# Patient Record
Sex: Male | Born: 1950 | Race: White | Hispanic: No | Smoking: Never smoker
Health system: Southern US, Community
[De-identification: ages and names within clinical notes are randomized; demographics above are authoritative.]

## PROBLEM LIST (undated history)

## (undated) DIAGNOSIS — S83206A Unspecified tear of unspecified meniscus, current injury, right knee, initial encounter: Secondary | ICD-10-CM

## (undated) DIAGNOSIS — E785 Hyperlipidemia, unspecified: Secondary | ICD-10-CM

## (undated) DIAGNOSIS — M1990 Unspecified osteoarthritis, unspecified site: Secondary | ICD-10-CM

## (undated) DIAGNOSIS — K635 Polyp of colon: Secondary | ICD-10-CM

## (undated) DIAGNOSIS — R0602 Shortness of breath: Secondary | ICD-10-CM

## (undated) HISTORY — DX: Hyperlipidemia, unspecified: E78.5

## (undated) HISTORY — PX: CARDIAC CATHETERIZATION: SHX172

## (undated) HISTORY — PX: CATARACT EXTRACTION W/ INTRAOCULAR LENS  IMPLANT, BILATERAL: SHX1307

## (undated) HISTORY — DX: Polyp of colon: K63.5

---

## 1979-05-08 HISTORY — PX: LUMBAR FUSION: SHX111

## 1999-12-16 ENCOUNTER — Inpatient Hospital Stay (HOSPITAL_COMMUNITY): Admission: EM | Admit: 1999-12-16 | Discharge: 1999-12-18 | Payer: Self-pay | Admitting: Emergency Medicine

## 1999-12-16 ENCOUNTER — Encounter: Payer: Self-pay | Admitting: Emergency Medicine

## 1999-12-17 ENCOUNTER — Encounter: Payer: Self-pay | Admitting: Cardiovascular Disease

## 2006-06-02 ENCOUNTER — Ambulatory Visit: Payer: Self-pay | Admitting: Family Medicine

## 2006-06-03 ENCOUNTER — Encounter (INDEPENDENT_AMBULATORY_CARE_PROVIDER_SITE_OTHER): Payer: Self-pay | Admitting: Internal Medicine

## 2006-06-03 LAB — CONVERTED CEMR LAB: PSA: 2.93 ng/mL

## 2006-06-09 ENCOUNTER — Ambulatory Visit: Payer: Self-pay | Admitting: Cardiology

## 2006-06-15 ENCOUNTER — Ambulatory Visit: Payer: Self-pay | Admitting: Cardiology

## 2006-06-15 ENCOUNTER — Ambulatory Visit (HOSPITAL_COMMUNITY): Admission: RE | Admit: 2006-06-15 | Discharge: 2006-06-15 | Payer: Self-pay | Admitting: Cardiology

## 2006-06-17 ENCOUNTER — Inpatient Hospital Stay (HOSPITAL_BASED_OUTPATIENT_CLINIC_OR_DEPARTMENT_OTHER): Admission: RE | Admit: 2006-06-17 | Discharge: 2006-06-17 | Payer: Self-pay | Admitting: Cardiovascular Disease

## 2006-06-17 ENCOUNTER — Ambulatory Visit: Payer: Self-pay | Admitting: Cardiovascular Disease

## 2006-07-01 ENCOUNTER — Ambulatory Visit: Payer: Self-pay | Admitting: Cardiology

## 2007-04-18 ENCOUNTER — Ambulatory Visit: Payer: Self-pay | Admitting: Internal Medicine

## 2007-04-18 DIAGNOSIS — K4090 Unilateral inguinal hernia, without obstruction or gangrene, not specified as recurrent: Secondary | ICD-10-CM | POA: Insufficient documentation

## 2007-04-19 ENCOUNTER — Encounter (INDEPENDENT_AMBULATORY_CARE_PROVIDER_SITE_OTHER): Payer: Self-pay | Admitting: Internal Medicine

## 2007-05-24 ENCOUNTER — Ambulatory Visit (HOSPITAL_COMMUNITY): Admission: RE | Admit: 2007-05-24 | Discharge: 2007-05-24 | Payer: Self-pay | Admitting: *Deleted

## 2007-05-24 HISTORY — PX: INGUINAL HERNIA REPAIR: SUR1180

## 2007-10-15 ENCOUNTER — Emergency Department (HOSPITAL_COMMUNITY): Admission: EM | Admit: 2007-10-15 | Discharge: 2007-10-15 | Payer: Self-pay | Admitting: Family Medicine

## 2007-12-19 ENCOUNTER — Ambulatory Visit: Payer: Self-pay | Admitting: Family Medicine

## 2007-12-19 DIAGNOSIS — L989 Disorder of the skin and subcutaneous tissue, unspecified: Secondary | ICD-10-CM | POA: Insufficient documentation

## 2007-12-21 ENCOUNTER — Encounter (INDEPENDENT_AMBULATORY_CARE_PROVIDER_SITE_OTHER): Payer: Self-pay | Admitting: Internal Medicine

## 2007-12-21 DIAGNOSIS — E785 Hyperlipidemia, unspecified: Secondary | ICD-10-CM

## 2007-12-21 DIAGNOSIS — F528 Other sexual dysfunction not due to a substance or known physiological condition: Secondary | ICD-10-CM

## 2007-12-21 DIAGNOSIS — H269 Unspecified cataract: Secondary | ICD-10-CM

## 2007-12-21 LAB — CONVERTED CEMR LAB
Albumin: 4.1 g/dL (ref 3.5–5.2)
Alkaline Phosphatase: 71 units/L (ref 39–117)
BUN: 14 mg/dL (ref 6–23)
Bilirubin, Direct: 0.1 mg/dL (ref 0.0–0.3)
Calcium: 9.7 mg/dL (ref 8.4–10.5)
Cholesterol: 209 mg/dL (ref 0–200)
Eosinophils Absolute: 0.1 10*3/uL (ref 0.0–0.7)
GFR calc Af Amer: 112 mL/min
GFR calc non Af Amer: 92 mL/min
Glucose, Bld: 90 mg/dL (ref 70–99)
HCT: 46.7 % (ref 39.0–52.0)
Hemoglobin: 16.1 g/dL (ref 13.0–17.0)
MCV: 95.8 fL (ref 78.0–100.0)
Monocytes Absolute: 0.2 10*3/uL (ref 0.1–1.0)
Neutro Abs: 3.7 10*3/uL (ref 1.4–7.7)
PSA: 2.04 ng/mL (ref 0.10–4.00)
Platelets: 239 10*3/uL (ref 150–400)
Potassium: 4.2 meq/L (ref 3.5–5.1)
RDW: 12.3 % (ref 11.5–14.6)
Sodium: 142 meq/L (ref 135–145)
TSH: 0.78 microintl units/mL (ref 0.35–5.50)
Total Protein: 7 g/dL (ref 6.0–8.3)
Triglycerides: 88 mg/dL (ref 0–149)

## 2007-12-29 ENCOUNTER — Encounter (INDEPENDENT_AMBULATORY_CARE_PROVIDER_SITE_OTHER): Payer: Self-pay | Admitting: Internal Medicine

## 2008-04-06 ENCOUNTER — Ambulatory Visit: Payer: Self-pay | Admitting: Family Medicine

## 2008-04-06 ENCOUNTER — Observation Stay (HOSPITAL_COMMUNITY): Admission: EM | Admit: 2008-04-06 | Discharge: 2008-04-06 | Payer: Self-pay | Admitting: Emergency Medicine

## 2009-01-22 ENCOUNTER — Ambulatory Visit: Payer: Self-pay | Admitting: Family Medicine

## 2009-01-22 DIAGNOSIS — M79609 Pain in unspecified limb: Secondary | ICD-10-CM

## 2009-01-24 LAB — CONVERTED CEMR LAB
ALT: 30 units/L (ref 0–53)
AST: 26 units/L (ref 0–37)
BUN: 16 mg/dL (ref 6–23)
CO2: 29 meq/L (ref 19–32)
Calcium: 9 mg/dL (ref 8.4–10.5)
Chloride: 111 meq/L (ref 96–112)
Cholesterol: 215 mg/dL — ABNORMAL HIGH (ref 0–200)
Creatinine, Ser: 0.9 mg/dL (ref 0.4–1.5)
Direct LDL: 153.7 mg/dL
GFR calc non Af Amer: 92.04 mL/min (ref >60)
Glucose, Bld: 88 mg/dL (ref 70–99)
HDL: 50.3 mg/dL (ref >39.00)
PSA: 1.73 ng/mL (ref 0.10–4.00)
Potassium: 3.7 meq/L (ref 3.5–5.1)
Sodium: 142 meq/L (ref 135–145)
Total CHOL/HDL Ratio: 4
Triglycerides: 56 mg/dL (ref 0.0–149.0)
VLDL: 11.2 mg/dL (ref 0.0–40.0)

## 2009-04-22 ENCOUNTER — Ambulatory Visit: Payer: Self-pay | Admitting: Family Medicine

## 2009-04-24 LAB — CONVERTED CEMR LAB
Cholesterol: 199 mg/dL (ref 0–200)
HDL: 50.7 mg/dL (ref >39.00)
Triglycerides: 66 mg/dL (ref 0.0–149.0)
VLDL: 13.2 mg/dL (ref 0.0–40.0)

## 2009-10-28 ENCOUNTER — Encounter (INDEPENDENT_AMBULATORY_CARE_PROVIDER_SITE_OTHER): Payer: Self-pay | Admitting: *Deleted

## 2009-11-17 ENCOUNTER — Encounter: Payer: Self-pay | Admitting: Family Medicine

## 2009-11-17 ENCOUNTER — Ambulatory Visit: Payer: Self-pay | Admitting: Family Medicine

## 2009-11-18 LAB — CONVERTED CEMR LAB
LDL Cholesterol: 122 mg/dL — ABNORMAL HIGH (ref 0–99)
Total CHOL/HDL Ratio: 3.7
VLDL: 28 mg/dL (ref 0–40)

## 2010-01-23 ENCOUNTER — Ambulatory Visit: Payer: Self-pay | Admitting: Family Medicine

## 2010-01-26 LAB — CONVERTED CEMR LAB
Albumin: 4.2 g/dL (ref 3.5–5.2)
Alkaline Phosphatase: 79 units/L (ref 39–117)
BUN: 19 mg/dL (ref 6–23)
CO2: 32 meq/L (ref 19–32)
Calcium: 9.3 mg/dL (ref 8.4–10.5)
Cholesterol: 209 mg/dL — ABNORMAL HIGH (ref 0–200)
GFR calc non Af Amer: 86.17 mL/min (ref >60)
Glucose, Bld: 97 mg/dL (ref 70–99)
HDL: 53.7 mg/dL (ref >39.00)
PSA: 2.91 ng/mL (ref 0.10–4.00)
Sodium: 141 meq/L (ref 135–145)
Total Protein: 7 g/dL (ref 6.0–8.3)
Triglycerides: 69 mg/dL (ref 0.0–149.0)

## 2010-07-07 ENCOUNTER — Ambulatory Visit: Payer: Self-pay | Admitting: Family Medicine

## 2010-07-08 ENCOUNTER — Encounter: Payer: Self-pay | Admitting: Family Medicine

## 2010-07-08 LAB — CONVERTED CEMR LAB
ALT: 24 units/L (ref 0–53)
Alkaline Phosphatase: 68 units/L (ref 39–117)
Bilirubin, Direct: 0.1 mg/dL (ref 0.0–0.3)
Cholesterol: 188 mg/dL (ref 0–200)
Indirect Bilirubin: 0.5 mg/dL (ref 0.0–0.9)
LDL Cholesterol: 119 mg/dL — ABNORMAL HIGH (ref 0–99)
VLDL: 15 mg/dL (ref 0–40)

## 2010-10-06 NOTE — Assessment & Plan Note (Signed)
Summary: CPX FOR ANNUAL PHYSICAL   Vital Signs:  Patient profile:   60 year old male Height:      69 inches Weight:      188.13 pounds BMI:     27.88 Temp:     98.2 degrees F oral Pulse rate:   64 / minute Pulse rhythm:   regular BP sitting:   120 / 90  (right arm) Cuff size:   regular  Vitals Entered By: Linde Gillis CMA Duncan Dull) (Jan 23, 2010 8:12 AM) CC: complete physical   History of Present Illness: 60 yo male new to me here for CPX  Doing well, working a lot.  Works 14 hour days, does not have time to do anything but work. Still has not gone for his colonoscopy.  He wants to wait until he has some vacation later this year.  UTD on tetanus.  ED-uses viagra as needed. Would like a refill.  Last lipid panel checked in 04/2009- LDL 135, HDL 50, TG 66.    Current Medications (verified): 1)  Adult Aspirin Ec Low Strength 81 Mg  Tbec (Aspirin) .... Take 1 Tablet By Mouth Once A Day 2)  Viagra 100 Mg  Tabs (Sildenafil Citrate) .Marland Kitchen.. 1 Once Daily As Directed  Allergies (verified): No Known Drug Allergies  Past History:  Family History: Last updated: Jan 08, 2008 Father: died at 58--cancer of ?stomach vs colon,  Mother: died at 86--breast ca with mets, alzheimers Siblings: 4 sisters--1 new DM,                                 3 L&W                4 br--1 --MI at 60yrs old, smoker--cant breath                         1 --MI at 60 yrs old, quad bypass, increased lipids,                          2--L&W   DM-son,  MI- 0 CVA- 0 Prostate Cancer- 0 Breast Cancer- 0 Ovarian Cancer-0 Uterine Cancer-0 Colon Cancer- 0 Drug/ ETOH Abuse-0 Depression-  0  Social History: Last updated: 01-08-08 Marital Status: Married Children: 3--all out of the house, grand children--6 Occupation: pulls parts at [employer]  Risk Factors: Alcohol Use: 0 (01/22/2009) Caffeine Use: 8 (01/22/2009) Exercise: no (01/22/2009)  Risk Factors: Smoking Status: never (01/22/2009) Passive Smoke  Exposure: no (2008-01-08)  Review of Systems      See HPI General:  Denies malaise. Eyes:  Denies blurring. ENT:  Denies difficulty swallowing. CV:  Denies difficulty breathing at night, shortness of breath with exertion, swelling of feet, and swelling of hands. Resp:  Denies shortness of breath. GI:  Denies abdominal pain, bloody stools, and change in bowel habits. GU:  Complains of erectile dysfunction; denies discharge and dysuria. MS:  Denies joint pain, joint redness, and joint swelling. Derm:  Denies rash. Neuro:  Denies headaches. Psych:  Denies anxiety and depression. Endo:  Denies cold intolerance and heat intolerance. Heme:  Denies abnormal bruising and bleeding. Allergy:  Denies seasonal allergies.  Physical Exam  General:  alert, well-developed, well-nourished, and well-hydrated.   Head:  normocephalic.   Eyes:  pupils equal, pupils round, and no injection.   Ears:  R ear normal and L ear normal.  Nose:  no nasal discharge, no mucosal edema, and no airflow obstruction.   Mouth:  pharynx pink and moist and poor dentition--several cavities and 1 broken tooth on L lower .   Neck:  no masses, no thyromegaly, no JVD, and no carotid bruits.   Lungs:  normal respiratory effort, no intercostal retractions, no accessory muscle use, normal breath sounds, no crackles, and no wheezes.   Heart:  normal rate, regular rhythm, and no murmur.   Abdomen:  soft, normal bowel sounds, no distention, no masses, no guarding, no abdominal hernia, no inguinal hernia, no hepatomegaly, and no splenomegaly.    Msk:  No deformity or scoliosis noted of thoracic or lumbar spine.   Extremities:  no edema either lower leg Neurologic:  alert & oriented X3, strength normal in all extremities, sensation intact to light touch, and gait normal.   Skin:  turgor normal and color normal.   Psych:  normally interactive and good eye contact.     Impression & Recommendations:  Problem # 1:  WELL ADULT EXAM  (ICD-V70.0) Reviewed preventive care protocols, scheduled due services, and updated immunizations Discussed nutrition, exercise, diet, and healthy lifestyle.  BMET, FLP, liver function, PSA today. Orders: TLB-BMP (Basic Metabolic Panel-BMET) (80048-METABOL)  Complete Medication List: 1)  Adult Aspirin Ec Low Strength 81 Mg Tbec (Aspirin) .... Take 1 tablet by mouth once a day 2)  Viagra 100 Mg Tabs (Sildenafil citrate) .Marland Kitchen.. 1 once daily as directed  Other Orders: Venipuncture (24401) TLB-Lipid Panel (80061-LIPID) TLB-Hepatic/Liver Function Pnl (80076-HEPATIC) TLB-PSA (Prostate Specific Antigen) (84153-PSA) Prescriptions: VIAGRA 100 MG  TABS (SILDENAFIL CITRATE) 1 once daily as directed  #12 x 12   Entered and Authorized by:   Ruthe Mannan MD   Signed by:   Ruthe Mannan MD on 01/23/2010   Method used:   Electronically to        [facility] (retail)       [address]       [place], [place]  [id number]       Ph: [phone]       Fax: [phone]   RxID:   [id number]   Current Allergies (reviewed today): No known allergies

## 2010-10-06 NOTE — Consult Note (Signed)
Summary: Consultation Report  Consultation Report   Imported By: Beau Fanny 05/02/2007 11:56:45  _____________________________________________________________________  External Attachment:    Type:   Image     Comment:   External Document

## 2010-10-06 NOTE — Letter (Signed)
Summary: [place] No Show Letter  [place] at [facility]  [address] [place], [place] [address]   Phone: [phone]  Fax: [phone]    10/28/2009 MRN: [id number]  NICHOLLAS PERUSSE [address] [place], [place]  [address]   Dear Mr. MOIST,   Our records indicate that you missed your scheduled appointment with ___lab__________________ on ___2.22.11_________.  Please contact this office to reschedule your appointment as soon as possible.  It is important that you keep your scheduled appointments with your physician, so we can provide you the best care possible.  Please be advised that there may be a charge for "no show" appointments.    Sincerely,    at [facility]

## 2010-10-06 NOTE — Letter (Signed)
Summary: Generic Letter  [place] at [facility]  [address] [place], [place] [address]   Phone: [phone]  Fax: [phone]    07/08/2010  OSWIN GRIFFITH [address] [place], [place]  [address]  Dear Mr. JUNIOUS,     We have received your lab results and Dr. Dayton Martes says that your cholesterol has improved since May.  Keep up the good work!  You have normal liver function.      Sincerely,       Linde Gillis CMA (AAMA)for Dr. Ruthe Mannan

## 2011-01-19 NOTE — H&P (Signed)
NAMEKENTARO, ALEWINE                 ACCOUNT NO.:  [id number]   MEDICAL RECORD NO.:  [id number]          PATIENT TYPE:  OBV   LOCATION:  6715                         FACILITY:  [facility]   PHYSICIAN:  Pearlean Brownie, M.D.DATE OF BIRTH:  1951-06-09   DATE OF ADMISSION:  04/05/2008  DATE OF DISCHARGE:                              HISTORY & PHYSICAL   PRIMARY CARE PHYSICIAN:  Unassigned.   CHIEF COMPLAINT:  Carbon monoxide poisoning.   HISTORY OF PRESENT ILLNESS:  This is a 60 year old male with no  significant past medical history who is presenting with a loss of  consciousness and headache after using a propane floor buffer inside.  In the emergency department, he was found to have a 29%  carboxyhemoglobin level and was put on 100% oxygen nonrebreather.  He  complains of chest tightness when he awoke, but this resolved  spontaneously and he currently has none.  He currently has a headache  and nausea without vomiting.  He denies shortness of breath, fatigue,  focal weakness, or paresthesia.  He has fell onto the vinyl interior  floors.  He was puffing but did not strike his head.  In the emergency  department, two sets of point of care cardiac enzymes are negative, and  his EKG shows no ischemic patterns.  The patient's urine drug screen is  negative, and his alcohol screen is consistent with his stated  consumption of two beers.  He was placed on 100% nonrebreather oxygen in  the emergency department.   PAST MEDICAL HISTORY:  1. Inguinal hernia surgery in September 2008.  2. Typical angina with normal cardiac catheterization in October 2007.   FAMILY HISTORY:  Negative for early coronary artery disease.   SOCIAL HISTORY:  He lives in [place].  He is married but is in the  process of separating from his wife.  He has adult children.  He does  not smoke or use illicit drugs, and he does drink two beers nightly.  He  is currently working at two separate jobs.  He works in a  salvage ER  during the day, and he does cleaning at night.   ALLERGIES:  No known drug allergies.   MEDICATIONS:  No chronic medications.   REVIEW OF SYSTEMS:  Per HPI.   PHYSICAL EXAM:  VITAL SIGNS:  Heart rate 57-95, respirations 20-24,  blood pressure 125-157 over 75-86, saturations 97-100% on nonrebreather.  GENERAL:  He is alert and oriented.  No acute distress, talkative,  cooperative, and pleasant.  HEENT: Normocephalic and atraumatic.  Pupils are equally round and  reactive to light.  Extraocular muscles are intact.  CARDIO:  Regular rate and rhythm with no murmurs, rubs, or gallops.  2+  radial pulses bilateral.  PULMONARY:  Clear to auscultation bilateral with normal work of  breathing.  EXTREMITIES:  Nontender without edema.   LABS AND RADIOLOGY:  His carboxyhemoglobin is 29%, venous blood gas has  a pH of 7.417, pCO2 of 38, pO2 of 56, and bicarb of 24.6.  Urine drug  screen negative.  Alcohol level is 66.  Basic metabolic  panel, sodium  141, potassium 3.4, chloride 107, bicarb 24, BUN 21, creatinine 1.2,  glucose 106.  Complete blood count, white blood cell 5.9, hemoglobin  15.7, hematocrit 44.9, platelets 215.   ASSESSMENT AND PLAN:  This is a 60 year old male with no significant  past medical history presenting with carbon monoxide poisoning.  1. Carbon monoxide poisoning.  His history symptoms and labs are      consistent with an accidental carbonmonoxide poisoning incident.      Treatment for carboxyhemoglobin less than 40% is oxygen at 100% via      nonrebreather until carboxyhemoglobin is less than 10% and there      are no focal neurologic signs or coronary ischemia.  Since the half-      life of carboxyhemoglobin is approximately 90 units on      nonrebreather, the patient should be cleared of carbon monoxide by      early morning.  We will continue with nonrebreather and recheck      carboxyhemoglobin at 7:00 a.m..  We will also check one more set of       cardiac enzymes to completely rule out cardiac ischemia, which      would be secondary to the carbon monoxide poisoning.  The patient      is in no distress and without signs of end-organ damage.  2. Fluids, electrolytes, nutrition/gastrointestinal.  Regular adult      diet.  Hep-Lock IVF.  3. Prophylaxis.  Early ambulation.  4. Disposition.  A 23-hour observation likely home in the morning.      Discharge criteria are less than 10% carboxyhemoglobin, no focal      neurologic signs, no coronary ischemia, and no signs of end-organ      damage.      Romero Belling, MD  Electronically Signed      Pearlean Brownie, M.D.  Electronically Signed    MO/MEDQ  D:  04/06/2008  T:  04/06/2008  Job:  [id number]

## 2011-01-19 NOTE — Discharge Summary (Signed)
[name], [name] [name]                 ACCOUNT NO.:  [id number]   MEDICAL RECORD NO.:  [id number]          PATIENT TYPE:  OBV   LOCATION:  6715                         FACILITY:  [facility]   PHYSICIAN:  Pearlean Brownie, M.D.DATE OF BIRTH:  [birth year]   DATE OF ADMISSION:  04/05/2008  DATE OF DISCHARGE:  04/06/2008                               DISCHARGE SUMMARY   REASON FOR ADMISSION:  Carbon monoxide poisoning.   SIGNIFICANT FINDINGS:  The patient was a 60 year old male with no past  medical history who presented with loss of consciousness and headache  after using a propane for buffer inside a closed space.  The patient was  found in the ED to have a 29% carboxyhemoglobin level and an alcohol  level of 66 mg/dL.  Cardiac enzymes were negative x3.  The patient was  put on 100% non-rebreather mask and symptoms of headache and chest  tightness slowly resolved.  On discharge, the patient is asymptomatic  with only a mild headache carboxyhemoglobin level is normal at 0.5% the  patient metabolic panel was within normal limits.  CBC was within normal  limits.  Urine drug screen was negative.  The patient will be discharged  today with instructions to follow up with the [facility] and  to avoid using internal combustion engines in closed spaces.   Follow up will be with [facility].  The patient  was instructed to call on Monday to make an appointment to see me in one  week and is instructed to go to the ED if he develops shortness of  breath, chest pain, or dizziness.   CONDITION AT DISCHARGE:  Stable and improved.  There will be no  medications given to the patient on discharge.  He is instructed that he  can use over-the-counter Tylenol if desired for headache.      Rodney Langton, MD  Electronically Signed      Pearlean Brownie, M.D.  Electronically Signed    TT/MEDQ  D:  04/06/2008  T:  04/07/2008  Job:  [id number]

## 2011-01-19 NOTE — Op Note (Signed)
[name] [name], [name] [name]                 ACCOUNT NO.:  [id number]   MEDICAL RECORD NO.:  [id number]          PATIENT TYPE:  AMB   LOCATION:  DAY                          FACILITY:  [facility]   PHYSICIAN:  Alfonse Ras, MD   DATE OF BIRTH:  1951-04-18   DATE OF PROCEDURE:  05/24/2007  DATE OF DISCHARGE:                               OPERATIVE REPORT   PREOPERATIVE DIAGNOSIS:  Right inguinal hernia.   POSTOPERATIVE DIAGNOSIS:  Right inguinal hernia.   PROCEDURE:  Right inguinal hernia repair with mesh.   SURGEON:  Alfonse Ras, M.D.   ANESTHESIA:  General laryngeal mask.   SPECIMENS:  None.   ESTIMATED BLOOD LOSS:  Minimal.   DESCRIPTION OF PROCEDURE:  The patient was taken to the operating room  and placed in the supine position.  After adequate general anesthesia  was induced using the laryngeal mask, the right groin was prepped and  draped in normal sterile fashion.  Using an oblique incision over the  inguinal canal, I dissected down using Bovie electrocautery to the  external oblique fascia.  This was opened along its fibers down to the  external ring.  The ilioinguinal nerve was identified and preserved and  retracted cephalad.  The spermatic cord was surrounded with a Penrose  drain right at the pubic tubercle.  Direct hernia defect was identified  and was closed primarily in a tension-free fashion by approximating the  inguinal ligament to the transversalis fascia with interrupted 0  Surgilon sutures.  This was taken up to the internal ring.  No indirect  hernia sac was identified.  A piece of onlay 3 x 6 atrium mesh was then  placed over the repair and was cut to fashion.  It was split and brought  out lateral to the internal ring.  It was fixed in place starting at the  pubic tubercle with a running 2-0 Prolene suture to the transversalis  fascia, split and brought out lateral to the internal ring.  It was also  fixed to the inguinal ligament.  This was fixed out to the  transversalis  fascia laterally.  The defect in the mesh lateral to the internal ring  was closed along with a 2-0 Prolene suture to allow only a Kelly clamp  and the tip of the Kelly clamp to be allowed next to the spermatic cord  at the internal ring.  All tissues were injected using Marcaine.  The  hernia repair was completely intact and the external oblique fascia was  closed with a running 3-0 Vicryl suture.  Skin incision was closed with  staples.  A sterile dressing was applied.  The patient tolerated the  procedure well and went to PACU in good condition.      Alfonse Ras, MD  Electronically Signed     KRE/MEDQ  D:  05/24/2007  T:  05/24/2007  Job:  [id number]

## 2011-01-22 ENCOUNTER — Encounter: Payer: Self-pay | Admitting: Family Medicine

## 2011-01-22 NOTE — Discharge Summary (Signed)
[place]  Patient:    [name] [name], [name] [name]                     MRN: [id number] Adm. Date:  [id number] Disc. Date: 12/18/99 Attending:  Colon Branch Dictator:   Joellyn Rued, P.A.C. CC:         Olena [facility]                  Referring Physician Discharge [place]  DATE OF BIRTH:  1951-03-14  SUMMARY OF HISTORY:  [name] is a 60 year old white male without prior cardiac  history.  He presented with sudden sharp left-sided chest discomfort.  He denies prior history of chest discomfort at rest or with exertion.  His risk factors include hyperlipidemia.  Around 4 p.m. 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  He was seen by the Urgent Care staff on [address] and prescribed two aspirin and sublingual nitroglycerin with some relief, but the discomfort continued to wax and wane.  Currently it is a 5/10.  His admission EKG showed sinus bradycardia, a left axis deviation, diffuse ST segment elevations,  nonspecific ST-T wave changes.  The discomfort was pleuritic in nature.  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  LABORATORY DATA:  H&H of 15.2 and 41.0, MCHC was slightly elevated at 37.1, platelets 253, wbcs 8.2.  Sodium 139, potassium 3.7, BUN 20, creatinine 0.9, glucose 116.  CKs and troponins were negative x 3.  Fasting lipids showed a total cholesterol of 186, triglycerides 186, HDL 121, LDL 41, with a ratio of 4.5.  Chest x-ray is not on the chart.  EKG showed sinus bradycardia, left axis deviation, nonspecific ST-T wave changes.  HOSPITAL COURSE:  [name] was admitted to 5100.  Overnight, he did not have any further  discomfort and enzymes and EKGs were negative for myocardial infarction. An adenosine Cardiolite was performed on April 12 without difficulty.  Imaging showed an EF of 56%, no ischemia or scarring.  Dr. Eden Emms reviewed these findings and felt that he could be discharged home.  DISCHARGE DISPOSITION:  MEDICATIONS:  He is asked to continue his propranolol 40 mg t.i.d.  DIET:  Maintain low salt/fat/cholesterol diet.  ACTIVITY:  He stated that he could go back to exercising.  FOLLOW-UP:  He was asked to arrange a follow-up appointment with ______ of [facility]. DD:  12/18/99 TD:  12/18/99 Job: [id number]

## 2011-01-22 NOTE — Assessment & Plan Note (Signed)
[facility]                                   ON-CALL NOTE   NAME:[name] [name], [name] [name]                        MRN:          [id number]  DATE:06/12/2006                            DOB:          04/01/1951    ON CALL NOTE:   PRIMARY CARDIOLOGIST:  Dr. Simona Huh.   [name] [name] is a 60 year old male with cardiac risk factors but no known  disease.  He was evaluated by Dr. Diona Browner in June 09, 2006 and he was  started on Toprol XL 25 mg q. day and sublingual nitroglycerin.   I received a call from Mrs. Coye saying that she had lost the prescription  and was unable to locate it and requesting that it be called into [facility].  Initially the phone number which she gave me did not work and she stated  that she would assist me in getting the phone number for the [facility] which  she wished to use, which was not listed in any of the materials that I have  available to me.  She stated that if I would call this into [facility] she  would go and get it filled and he would begin taking it.  She stated that he  was not currently having any chest pain or shortness of breath.  He is  currently asymptomatic.  I told her that I would call in the medication as  soon as I was able to locate the correct phone number.      ______________________________  Theodore Demark, PA-C    ______________________________  Gerrit Friends. Dietrich Pates, MD, [facility]     RB/MedQ  DD:  06/12/2006  DT:  06/13/2006  Job #:  [id number]   cc:   Jonelle Sidle, MD

## 2011-01-22 NOTE — Cardiovascular Report (Signed)
NAMEHARLOW, [name] [name]                 ACCOUNT NO.:  [id number]   MEDICAL RECORD NO.:  [id number]          PATIENT TYPE:  OIB   LOCATION:  1963                         FACILITY:  [facility]   PHYSICIAN:  Veverly Fells. Excell Seltzer, MD  DATE OF BIRTH:  08-Oct-1950   DATE OF PROCEDURE:  06/17/2006  DATE OF DISCHARGE:  06/17/2006                              CARDIAC CATHETERIZATION   INDICATION:  Mr. Kapaun is a very pleasant 60 year old male who has developed  recent onset of typical angina.  He has seen Dr. Diona Browner in the outpatient  clinic, and with his suggestive history for cardiac angina, he was referred  for diagnostic catheterization.   PROCEDURE:  Left heart catheterization, selective coronary angiography, left  ventricular angiography.   ACCESS:  Right femoral artery 4-French sheath.   COMPLICATIONS:  None.   PROCEDURAL DETAILS:  The risks and indications of the procedure were  explained in detail to the patient.  Informed consent was obtained.  Using a  normal sterile technique, the right groin was prepped, draped and  anesthetized with 1% lidocaine.  Using the modified Seldinger technique, a 4-  French arterial sheath was placed in the right common femoral artery.  Multiple angiographic views of both the left and right coronary arteries  were taken.  For the left coronary artery, a preformed JL4 catheter was  used.  For the right coronary artery, a preformed JR4 catheter was used.  Following selective coronary angiography, a 4-French angled pigtail catheter  was placed in the left ventricle, and left ventricular pressures were  recorded.  A right anterior oblique left ventriculogram was performed.  Following left ventriculography, a pullback across the aortic valve was  performed.   All catheter exchanges were performed over a wire.  At the conclusion in the  case, the femoral sheath was pulled and manual pressure was used for  hemostasis.   FINDINGS:  Aortic pressure 105/67, with a  mean of 85, left ventricular  pressure 107/0 with an end diastolic pressure of 10.  There is no aortic  stenosis.   Coronary angiography at left main stem was angiographically normal.  It  trifurcates into the LAD, ramus intermedius and left circumflex.   LAD - The LAD is a medium caliber vessel.  It courses around to the left  ventricular apex.  It gives off multiple septal perforators.  There were no  diagonal branches present.  The LAD is angiographically normal.   Ramus intermedius is a medium diameter vessel.  It bifurcates at its mid  portion.  It is angiographically normal.   The left circumflex is a large diameter vessel.  It gives off a small obtuse  marginal and a medium caliber second obtuse marginal.  There is a very large  left posterolateral branch.  The entire left circumflex system is  angiographically normal.   Right coronary artery is medium caliber.  It gives off an RV marginal branch  in its mid portion.  Distally, it bifurcates into a 1 posterolateral branch  and a PDA.  The PDA is relatively small.  The entire right coronary  artery  is angiographically normal.   Left ventriculogram shows normal left ventricular function with a left  ventricular ejection fraction of 55%.   ASSESSMENT:  1. Normal coronary arteries.  2. Normal left ventricular function.   PLAN:  The patient was recently started on metoprolol for his angina.  We  will discontinue his metoprolol.  He will follow up on a p.r.n. basis.      Veverly Fells. Excell Seltzer, MD  Electronically Signed     MDC/MEDQ  D:  06/17/2006  T:  06/17/2006  Job:  [id number]   cc:   Jonelle Sidle, MD

## 2011-01-22 NOTE — Assessment & Plan Note (Signed)
[facility]                              CARDIOLOGY OFFICE NOTE   NAME:[name], [name] STAFFORD                        MRN:          [id number]  DATE:07/01/2006                            DOB:          [birth year]    PRIMARY CARE Alahni Varone:  Atha Starks. Bean, FNP.   REASON FOR VISIT:  Followup cardiac catheterization.   HISTORY OF PRESENT ILLNESS:  I saw [name] [name] back in October.  He presented  at that time with symptoms suggestive of angina.  After discussion of  diagnostic options, he ultimately agreed to a cardiac catheterization, which  was performed by Dr. Excell Seltzer on October 12.  This study revealed normal  coronary arteries with normal left ventricular systolic function of 55%.  He  was reassured by the findings and, on presentation today, states that he has  actually been feeling much better.  Clear etiology of his symptoms is not  certain, although his cardiac status looks to be quite reassuring.  I spoke  with him about basic risk factor modification and observation of any marked  symptom change.  He has had no problems with his right groin site status  post catheterization.   ALLERGIES:  No known drug allergies.   PRESENT MEDICATIONS:  None at this time.   REVIEW OF SYSTEMS:  As per in the history of present illness.   EXAMINATION:  Blood pressure is 134/87, heart rate is 65, weight is 182  pounds.  The patient is comfortable in no acute distress.  Examination of the neck reveals no elevated jugular venous pressure, without  bruits.  CARDIAC:  Regular rate and rhythm without rub, murmur, or gallop.  Examination of the right groin site reveals no hematoma or bruit with well-  healed site.  EXTREMITIES:  No pitting edema.   IMPRESSION/RECOMMENDATIONS:  1. Recently documented normal coronary arteries with normal ejection      fraction at cardiac catheterization.  I have recommended the strategy      of risk factor modification.  His blood pressure  is mildly elevated and      I have suggested that he followup with Billie Bean in this regard.  He      should also have a baseline set of lipids obtained and be observant of      any new or concerning symptoms.  He will otherwise follow up with [place] as      needed.  2. Continue regular followup with primary care Jimmylee Ratterree.     Jonelle Sidle, MD   SGM/MedQ  DD: 07/01/2006  DT: 07/03/2006  Job #: [id number]   cc:   Billie D. Bean, FNP

## 2011-01-22 NOTE — Assessment & Plan Note (Signed)
[facility]                              CARDIOLOGY OFFICE NOTE   NAME:[name] [name], [name] [name]                        MRN:          [id number]  DATE:06/09/2006                            DOB:          1951-03-31    REFERRING PHYSICIAN:  Billie D. Bean, FNP   REASON FOR CONSULTATION:  Chest pain and abnormal electrocardiogram.   HISTORY OF PRESENT ILLNESS:  [name] [name] is a pleasant 60 year old male with no  reported history of hypertension, type 2 diabetes mellitus or dyslipidemia.  He does have a history of gastroesophageal reflux disease and is status post  previous Nissen fundoplication.  He works at [employer] and does a fair  amount of exertional labor.  He is typically not bothered by any symptoms  but states that approximately 2 weeks ago, actually while he was at rest, he  experienced a sharp left sided chest pain with some radiation to the left  upper arm and also a feeling of clamminess in his hands.  This lasted for  a few minutes and resolved spontaneously.  Since then, occasionally he has  noticed a feeling of chest pain described as a tightness again in his left  chest with more extremes of exertion such as very heavy lifting (a car  door.)  Otherwise he is not experiencing any dyspnea on exertion or clearly  reproducible chest pain at lower level intensity.  He was seen recently in  the [facility] office and had an electrocardiogram which shows sinus  rhythm at 65 beats per minute with a left anterior fascicular block.  There  are no frank inferior Q waves, with a very small R wave seen in the inferior  leads, arguing against an inferior infarct pattern.  He has had no prior  cardiac testing.   ALLERGIES:  No known drug allergies.   PRESENT MEDICATIONS:  None chronically.   PAST MEDICAL HISTORY:  Is as outlined in the history of present illness.  He  has had previous Nissen fundoplication as outlined as well as back surgery  in August  of 1986.   FAMILY HISTORY:  Noncontributory for premature cardiovascular disease.   SOCIAL HISTORY:  Patient is married, has 3 children. He works at [employer].  He has no significant tobacco use history.  He does state that he  drinks 2 beers daily.  He does not have a routine exercise regimen.   EXAMINATION:  Weight is 174 pounds.  Blood pressure 145/85.  Heart rate is  88.  Patient is well-nourished and in no acute distress.  HEENT:  Conjunctivae and lids normal.  Pharynx is clear.  NECK:  Supple without elevated jugular venous pressure or loud bruits.  No  thyromegaly is noted.  LUNGS:  Clear without labored breathing.  CARDIAC:  Reveals a regular rate and rhythm without S3 gallop or loud  murmur.  There is no pericardial rub.  ABDOMEN:  Is soft with normoactive bowel sounds.  No bruits.  EXTREMITIES:  Show no pitting edema.  Distal pulses are 2+.   IMPRESSION/RECOMMENDATIONS:  Recent symptoms  of chest discomfort as outlined  with associated symptoms and resting electrocardiogram showing a left  anterior fascicular block pattern.  Symptoms are suggestive of angina.  There is no clearly documented history of hypertension, although his blood  pressure is elevated today.  Lipid status is uncertain at this time.  I had  a fairly frank discussion with him about additional cardiac testing to  assess for underlying obstructive coronary artery disease.  We reviewed the  potential risks and benefits of both noninvasive versus invasive testing.  After discussing this, he is considering a diagnostic cardiac  catheterization, which I have recommended that we arrange in the near  future.  He states that he would like to discuss this with his employer and  also his family and that he will call us back tomorrow morning to give [place] a  decision, so that we can make plans to move forward.  I have in the  meanwhile asked him to take an aspirin daily and also begin Toprol XL at 25  mg daily with  a prescription for as needed sublingual nitroglycerin.  I have  instructed him in the use of these medications and have asked him to seek  medical attention if he has progressive or prolonged symptoms.  For the time  being, he should avoid strenuous activities, specifically lifting no more  than 30 pounds, and we will make further plans once additional information  is available.   Further plans to follow.       [name] Sidle, MD     SGM/MedQ  DD:  06/09/2006  DT:  06/10/2006  Job #:  [id number]   cc:   Billie D. Bean, FNP

## 2011-01-25 ENCOUNTER — Ambulatory Visit (INDEPENDENT_AMBULATORY_CARE_PROVIDER_SITE_OTHER): Payer: PRIVATE HEALTH INSURANCE | Admitting: Family Medicine

## 2011-01-25 ENCOUNTER — Encounter: Payer: Self-pay | Admitting: Family Medicine

## 2011-01-25 VITALS — BP 120/80 | HR 68 | Temp 97.8°F | Ht 70.5 in | Wt 186.1 lb

## 2011-01-25 DIAGNOSIS — R5381 Other malaise: Secondary | ICD-10-CM

## 2011-01-25 DIAGNOSIS — Z0000 Encounter for general adult medical examination without abnormal findings: Secondary | ICD-10-CM | POA: Insufficient documentation

## 2011-01-25 DIAGNOSIS — R5383 Other fatigue: Secondary | ICD-10-CM | POA: Insufficient documentation

## 2011-01-25 DIAGNOSIS — E785 Hyperlipidemia, unspecified: Secondary | ICD-10-CM

## 2011-01-25 DIAGNOSIS — Z125 Encounter for screening for malignant neoplasm of prostate: Secondary | ICD-10-CM

## 2011-01-25 LAB — LIPID PANEL
Cholesterol: 188 mg/dL (ref 0–200)
LDL Cholesterol: 124 mg/dL — ABNORMAL HIGH (ref 0–99)

## 2011-01-25 LAB — PSA: PSA: 2.92 ng/mL (ref 0.10–4.00)

## 2011-01-25 LAB — BASIC METABOLIC PANEL
BUN: 11 mg/dL (ref 6–23)
Chloride: 104 mEq/L (ref 96–112)
GFR: 97.64 mL/min (ref >60.00)
Glucose, Bld: 88 mg/dL (ref 70–99)
Potassium: 4.5 mEq/L (ref 3.5–5.1)
Sodium: 140 mEq/L (ref 135–145)

## 2011-01-25 LAB — CBC WITH DIFFERENTIAL/PLATELET
Basophils Absolute: 0 10*3/uL (ref 0.0–0.1)
Eosinophils Absolute: 0.1 10*3/uL (ref 0.0–0.7)
Lymphocytes Relative: 31.9 % (ref 12.0–46.0)
MCHC: 34.5 g/dL (ref 30.0–36.0)
MCV: 95.5 fl (ref 78.0–100.0)
Monocytes Absolute: 0.5 10*3/uL (ref 0.1–1.0)
Neutrophils Relative %: 56.4 % (ref 43.0–77.0)
Platelets: 206 10*3/uL (ref 150.0–400.0)
RDW: 13 % (ref 11.5–14.6)

## 2011-01-25 LAB — TSH: TSH: 0.82 u[IU]/mL (ref 0.35–5.50)

## 2011-01-25 MED ORDER — SILDENAFIL CITRATE 100 MG PO TABS: 100.0000 mg | ORAL_TABLET | Freq: Every day | ORAL | Status: DC | PRN

## 2011-01-25 NOTE — Progress Notes (Signed)
60 yo male here for CPX  Fatigue- over past month, feels more fatigued than usual.  Works 14 hour days, does not have time to do anything but work. Still has not gone for his colonoscopy.  His insurance will not cover much of it, he simply cannot afford it right now. No CP or SOB. No blood in stool. No fevers, chills or night sweats.   No difficulty starting or stopping urinary stream.   ED-uses viagra as needed. Would like a refill. HLD-  Last lipid panel  Lab Results  Component Value Date   CHOL 188 07/07/2010   CHOL 209* 01/23/2010   CHOL 206* 11/17/2009   Lab Results  Component Value Date   HDL 54 07/07/2010   HDL 53.70 01/23/2010   HDL 56 11/17/2009   Lab Results  Component Value Date   LDLCALC 119* 07/07/2010   LDLCALC 122* 11/17/2009   LDLCALC 135* 04/22/2009   Lab Results  Component Value Date   TRIG 76 07/07/2010   TRIG 69.0 01/23/2010   TRIG 141 11/17/2009    Lab Results  Component Value Date   ALT 24 07/07/2010   AST 20 07/07/2010   ALKPHOS 68 07/07/2010   BILITOT 0.6 07/07/2010    The PMH, PSH, Social History, Family History, Medications, and allergies have been reviewed in [facility], and have been updated if relevant.   Review of Systems       See HPI Eyes:  Denies blurring. ENT:  Denies difficulty swallowing. CV:  Denies difficulty breathing at night, shortness of breath with exertion, swelling of feet, and swelling of hands. Resp:  Denies shortness of breath. GI:  Denies abdominal pain, bloody stools, and change in bowel habits. GU:  Complains of erectile dysfunction; denies discharge and dysuria. MS:  Denies joint pain, joint redness, and joint swelling. Derm:  Denies rash. Neuro:  Denies headaches. Psych:  Denies anxiety and depression. Endo:  Denies cold intolerance and heat intolerance. Heme:  Denies abnormal bruising and bleeding. Allergy:  Denies seasonal allergies.  Physical Exam BP 120/80  Pulse 68  Temp(Src) 97.8 F (36.6 C) (Oral)  Ht 5'  10.5" (1.791 m)  Wt 186 lb 1.9 oz (84.423 kg)  BMI 26.33 kg/m2 General:  pleasant male in NAD Eyes:  PERRL Ears:  External ear exam shows no significant lesions or deformities.  Otoscopic examination reveals clear canals, tympanic membranes are intact bilaterally without bulging, retraction, inflammation or discharge. Hearing is grossly normal bilaterally. Nose:  External nasal examination shows no deformity or inflammation. Nasal mucosa are pink and moist without lesions or exudates. Mouth:  Oral mucosa and oropharynx without lesions or exudates.  Teeth in good repair. Neck:  no carotid bruit or thyromegaly no cervical or supraclavicular lymphadenopathy  Lungs:  Normal respiratory effort, chest expands symmetrically. Lungs are clear to auscultation, no crackles or wheezes. Heart:  Normal rate and regular rhythm. S1 and S2 normal without gallop, murmur, click, rub or other extra sounds. Abdomen:  Bowel sounds positive,abdomen soft and non-tender without masses, organomegaly or hernias noted. Pulses:  R and L posterior tibial pulses are full and equal bilaterally  Extremities:  no edema

## 2011-01-25 NOTE — Assessment & Plan Note (Signed)
Reviewed preventive care protocols, scheduled due services, and updated immunizations Discussed nutrition, exercise, diet, and healthy lifestyle.  

## 2011-01-25 NOTE — Assessment & Plan Note (Signed)
Deteriorated. Likely multifactorial. Pt continues to decline colonoscopy but agrees to IFOB. Order placed today. Will also check other labs to rule out other causes. Physical exam unremarkable. Orders Placed This Encounter  Procedures  . CBC w/Diff  . B12  . TSH  . Lipid panel  . Basic Metabolic Panel (BMET)  . PSA

## 2011-02-12 ENCOUNTER — Other Ambulatory Visit: Payer: Self-pay | Admitting: *Deleted

## 2011-02-12 MED ORDER — SILDENAFIL CITRATE 100 MG PO TABS: 100.0000 mg | ORAL_TABLET | Freq: Every day | ORAL | Status: DC | PRN

## 2011-02-12 NOTE — Telephone Encounter (Signed)
Patient called to request a Rx refill on Viagra.  Patient was advised that Dr. Dayton Martes is out of the office until Monday.

## 2011-02-15 MED ORDER — SILDENAFIL CITRATE 100 MG PO TABS: 100.0000 mg | ORAL_TABLET | Freq: Every day | ORAL | Status: DC | PRN

## 2011-02-15 NOTE — Telephone Encounter (Signed)
Patient advised as instructed via telephone, Rx left at front desk for pick up.

## 2011-02-26 ENCOUNTER — Other Ambulatory Visit: Payer: Self-pay | Admitting: *Deleted

## 2011-02-26 MED ORDER — SILDENAFIL CITRATE 100 MG PO TABS: 100.0000 mg | ORAL_TABLET | Freq: Every day | ORAL | Status: DC | PRN

## 2011-02-26 NOTE — Telephone Encounter (Signed)
Rx put in Dr. Elmer Sow IN box.

## 2011-02-26 NOTE — Telephone Encounter (Signed)
Pt is asking for a written script for viagra, he asks for one with a years worth of refills, he mails this to [place].  He just got a script but there were no refills on it.  Please call when ready for pick up.

## 2011-03-01 NOTE — Telephone Encounter (Signed)
Patient notified as instructed via telephone.  Rx ready for pick up will be left at front desk. 

## 2011-06-04 LAB — RAPID URINE DRUG SCREEN, HOSP PERFORMED
Amphetamines: NOT DETECTED
Barbiturates: NOT DETECTED
Benzodiazepines: NOT DETECTED
Cocaine: NOT DETECTED
Opiates: NOT DETECTED

## 2011-06-04 LAB — DIFFERENTIAL
Eosinophils Relative: 2
Lymphocytes Relative: 25
Lymphs Abs: 1.5
Monocytes Absolute: 0.4

## 2011-06-04 LAB — CBC
HCT: 44.9
Hemoglobin: 15.7
RBC: 4.57
WBC: 5.9

## 2011-06-04 LAB — POCT I-STAT, CHEM 8
BUN: 21
Calcium, Ion: 1.22
Creatinine, Ser: 1.2
TCO2: 24

## 2011-06-04 LAB — POCT CARDIAC MARKERS
CKMB, poc: 2.6
Myoglobin, poc: 93.4
Troponin i, poc: <0.05
Troponin i, poc: <0.05

## 2011-06-04 LAB — POCT I-STAT 3, VENOUS BLOOD GAS (G3P V)
Bicarbonate: 24.6 — ABNORMAL HIGH
O2 Saturation: 89
TCO2: 26
pCO2, Ven: 38.1 — ABNORMAL LOW

## 2011-06-04 LAB — CARBOXYHEMOGLOBIN
Carboxyhemoglobin: 0.5
O2 Saturation: 99.6
Total hemoglobin: 15.6

## 2011-06-04 LAB — ETHANOL: Alcohol, Ethyl (B): 66 — ABNORMAL HIGH

## 2011-06-17 LAB — CBC
MCHC: 34.7
MCV: 93.8
RBC: 4.69
RDW: 13.3

## 2011-06-17 LAB — DIFFERENTIAL
Basophils Absolute: 0
Basophils Relative: 1
Eosinophils Absolute: 0.1
Monocytes Relative: 7
Neutro Abs: 3.3
Neutrophils Relative %: 63

## 2011-08-13 ENCOUNTER — Other Ambulatory Visit: Payer: Self-pay | Admitting: Internal Medicine

## 2011-08-13 MED ORDER — SILDENAFIL CITRATE 100 MG PO TABS: 100.0000 mg | ORAL_TABLET | Freq: Every day | ORAL | Status: DC | PRN

## 2011-08-13 NOTE — Telephone Encounter (Signed)
Patient advised as instructed via telephone, Rx left at front desk for pick up.

## 2011-08-13 NOTE — Telephone Encounter (Signed)
Patient called for refill for Viagra he would like the Rx printed and he will pick it up.  Please call patient when ready.

## 2012-01-14 ENCOUNTER — Other Ambulatory Visit: Payer: Self-pay | Admitting: Family Medicine

## 2012-01-14 DIAGNOSIS — F528 Other sexual dysfunction not due to a substance or known physiological condition: Secondary | ICD-10-CM

## 2012-01-14 DIAGNOSIS — E785 Hyperlipidemia, unspecified: Secondary | ICD-10-CM

## 2012-01-14 DIAGNOSIS — Z0000 Encounter for general adult medical examination without abnormal findings: Secondary | ICD-10-CM

## 2012-01-19 ENCOUNTER — Other Ambulatory Visit (INDEPENDENT_AMBULATORY_CARE_PROVIDER_SITE_OTHER): Payer: BC Managed Care – PPO

## 2012-01-19 DIAGNOSIS — F528 Other sexual dysfunction not due to a substance or known physiological condition: Secondary | ICD-10-CM

## 2012-01-19 DIAGNOSIS — Z0000 Encounter for general adult medical examination without abnormal findings: Secondary | ICD-10-CM

## 2012-01-19 DIAGNOSIS — E785 Hyperlipidemia, unspecified: Secondary | ICD-10-CM

## 2012-01-19 LAB — LIPID PANEL
HDL: 49.8 mg/dL (ref >39.00)
LDL Cholesterol: 122 mg/dL — ABNORMAL HIGH (ref 0–99)
Total CHOL/HDL Ratio: 4
Triglycerides: 79 mg/dL (ref 0.0–149.0)

## 2012-01-19 LAB — COMPREHENSIVE METABOLIC PANEL
ALT: 28 U/L (ref 0–53)
AST: 24 U/L (ref 0–37)
Albumin: 4.1 g/dL (ref 3.5–5.2)
Alkaline Phosphatase: 66 U/L (ref 39–117)
BUN: 17 mg/dL (ref 6–23)
Creatinine, Ser: 0.9 mg/dL (ref 0.4–1.5)
Potassium: 4.4 mEq/L (ref 3.5–5.1)

## 2012-01-19 LAB — PSA: PSA: 3.67 ng/mL (ref 0.10–4.00)

## 2012-01-25 ENCOUNTER — Ambulatory Visit (INDEPENDENT_AMBULATORY_CARE_PROVIDER_SITE_OTHER): Payer: BC Managed Care – PPO | Admitting: Family Medicine

## 2012-01-25 ENCOUNTER — Ambulatory Visit (INDEPENDENT_AMBULATORY_CARE_PROVIDER_SITE_OTHER)
Admission: RE | Admit: 2012-01-25 | Discharge: 2012-01-25 | Disposition: A | Discharge status: Home / Self Care | Payer: BC Managed Care – PPO | Source: Ambulatory Visit | Attending: Family Medicine | Admitting: Family Medicine

## 2012-01-25 ENCOUNTER — Encounter: Payer: PRIVATE HEALTH INSURANCE | Admitting: Family Medicine

## 2012-01-25 VITALS — BP 144/94 | HR 60 | Temp 97.8°F | Ht 68.75 in | Wt 182.0 lb

## 2012-01-25 DIAGNOSIS — R0602 Shortness of breath: Secondary | ICD-10-CM

## 2012-01-25 DIAGNOSIS — Z0000 Encounter for general adult medical examination without abnormal findings: Secondary | ICD-10-CM

## 2012-01-25 DIAGNOSIS — Z1211 Encounter for screening for malignant neoplasm of colon: Secondary | ICD-10-CM

## 2012-01-25 DIAGNOSIS — E785 Hyperlipidemia, unspecified: Secondary | ICD-10-CM

## 2012-01-25 LAB — CBC WITH DIFFERENTIAL/PLATELET
Basophils Absolute: 0 10*3/uL (ref 0.0–0.1)
Eosinophils Absolute: 0.1 10*3/uL (ref 0.0–0.7)
Lymphocytes Relative: 28.2 % (ref 12.0–46.0)
MCHC: 34 g/dL (ref 30.0–36.0)
Neutrophils Relative %: 60.5 % (ref 43.0–77.0)
RDW: 13.3 % (ref 11.5–14.6)

## 2012-01-25 NOTE — Progress Notes (Signed)
61  yo male here for CPX  SOB- over past several months, has noticed that he is often out of breath at the end of a sentence.  No DOE, No CP. No word finding issues.  No cough. Pt is a non smoker. Has been a little more fatigued but works quite a bit.  Otherwise doing well.  HLD- Lab Results  Component Value Date   CHOL 188 01/19/2012   HDL 49.80 01/19/2012   LDLCALC 122* 01/19/2012   LDLDIRECT 156.1 01/23/2010   TRIG 79.0 01/19/2012   CHOLHDL 4 01/19/2012   Patient Active Problem List  Diagnoses  . HYPERLIPIDEMIA  . ERECTILE DYSFUNCTION  . CATARACT, LEFT EYE  . INGUINAL HERNIA, RIGHT  . SKIN LESION  . TOE PAIN  . Routine general medical examination at a health care facility  . Fatigue   Past Medical History  Diagnosis Date  . Hyperlipidemia    Past Surgical History  Procedure Date  . Hernia repair 11/2007    right  . Eye surgery 09/2006    both eyes   History  Substance Use Topics  . Smoking status: Never Smoker   . Smokeless tobacco: Not on file  . Alcohol Use: Not on file   Family History  Problem Relation Age of Onset  . Cancer Mother     breast ca with mets  . Dementia Mother     Alzheimers  . Cancer Father   . Diabetes Sister   . Hyperlipidemia Brother   . Heart disease Brother     quad bypass   Allergies not on file Current Outpatient Prescriptions on File Prior to Visit  Medication Sig Dispense Refill  . aspirin (ADULT ASPIRIN EC LOW STRENGTH) 81 MG EC tablet Take 81 mg by mouth daily.        . sildenafil (VIAGRA) 100 MG tablet Take 1 tablet (100 mg total) by mouth daily as needed.  10 tablet  11     The PMH, PSH, Social History, Family History, Medications, and allergies have been reviewed in [facility], and have been updated if relevant.   Review of Systems       See HPI Patient reports no  vision/ hearing changes,anorexia, weight change, fever ,adenopathy, persistant / recurrent hoarseness, swallowing issues, chest pain, edema,persistant /  recurrent cough, hemoptysis, dyspnea(rest, exertional, paroxysmal nocturnal), gastrointestinal  bleeding (melena, rectal bleeding), abdominal pain, excessive heart burn, GU symptoms(dysuria, hematuria, pyuria, voiding/incontinence  Issues) syncope, focal weakness, severe memory loss, concerning skin lesions, depression, anxiety, abnormal bruising/bleeding, major joint swelling.     Physical Exam BP 144/94  Pulse 60  Temp(Src) 97.8 F (36.6 C) (Oral)  Ht 5' 8.75" (1.746 m)  Wt 182 lb (82.555 kg)  BMI 27.07 kg/m2 General:  pleasant male in NAD Eyes:  PERRL Ears:  External ear exam shows no significant lesions or deformities.  Otoscopic examination reveals clear canals, tympanic membranes are intact bilaterally without bulging, retraction, inflammation or discharge. Hearing is grossly normal bilaterally. Nose:  External nasal examination shows no deformity or inflammation. Nasal mucosa are pink and moist without lesions or exudates. Mouth:  Oral mucosa and oropharynx without lesions or exudates.  Teeth in good repair. Neck:  no carotid bruit or thyromegaly no cervical or supraclavicular lymphadenopathy  Lungs:  Normal respiratory effort, chest expands symmetrically. Lungs are clear to auscultation, no crackles or wheezes. Heart:  Normal rate and regular rhythm. S1 and S2 normal without gallop, murmur, click, rub or other extra sounds. Abdomen:  Bowel  sounds positive,abdomen soft and non-tender without masses, organomegaly or hernias noted. Pulses:  R and L posterior tibial pulses are full and equal bilaterally  Extremities:  no edema   Assessment and Plan: 1. Routine general medical examination at a health care facility  Reviewed preventive care protocols, scheduled due services, and updated immunizations Discussed nutrition, exercise, diet, and healthy lifestyle. PSA, CMET within normal limits.   2. HYPERLIPIDEMIA  Lipids stable.   3. Screening for colon cancer  Ambulatory referral to  Gastroenterology  4. SOB (shortness of breath)  New- no abnormal sounds on lung exam today. Will get CXR given duration of symptoms and passive smoke exposure.  CBC as well. CBC with Differential, DG Chest 2 View

## 2012-01-25 NOTE — Patient Instructions (Signed)
Please stop by to see Shirlee Limerick to set up your colonoscopy.

## 2012-01-26 ENCOUNTER — Encounter: Payer: Self-pay | Admitting: Gastroenterology

## 2012-01-26 ENCOUNTER — Other Ambulatory Visit: Payer: Self-pay | Admitting: Family Medicine

## 2012-01-26 DIAGNOSIS — R0602 Shortness of breath: Secondary | ICD-10-CM

## 2012-02-07 ENCOUNTER — Encounter: Payer: Self-pay | Admitting: Gastroenterology

## 2012-02-07 ENCOUNTER — Ambulatory Visit (AMBULATORY_SURGERY_CENTER): Payer: BC Managed Care – PPO | Admitting: *Deleted

## 2012-02-07 VITALS — Ht 70.0 in | Wt 182.0 lb

## 2012-02-07 DIAGNOSIS — Z1211 Encounter for screening for malignant neoplasm of colon: Secondary | ICD-10-CM

## 2012-02-07 MED ORDER — PEG-KCL-NACL-NASULF-NA ASC-C 100 G PO SOLR: ORAL | Status: DC

## 2012-02-08 ENCOUNTER — Encounter: Payer: Self-pay | Admitting: Cardiovascular Disease

## 2012-02-08 ENCOUNTER — Ambulatory Visit (INDEPENDENT_AMBULATORY_CARE_PROVIDER_SITE_OTHER): Payer: BC Managed Care – PPO | Admitting: Cardiovascular Disease

## 2012-02-08 VITALS — BP 128/80 | HR 70 | Ht 70.0 in | Wt 185.0 lb

## 2012-02-08 DIAGNOSIS — R079 Chest pain, unspecified: Secondary | ICD-10-CM

## 2012-02-08 DIAGNOSIS — R0602 Shortness of breath: Secondary | ICD-10-CM

## 2012-02-08 NOTE — Progress Notes (Signed)
HPI:  This is a 61 year old gentleman presenting for initial cardiac evaluation. The patient has no personal history of cardiac disease. However, he has developed progressive fatigue and shortness of breath over the last 6 months. He has previously been able to perform vigorous exercise with no symptoms. He's had episodes of chest tightness across the left side of the chest, nonradiating, and nonexertional. His main complaint is shortness of breath with low-level activity. He denies orthopnea, PND, cough, palpitations, or leg swelling. He has no history of hypertension, diabetes, or hypercholesterolemia. He does have a family history of coronary artery disease as he has a younger brother who has had multiple coronary stents. The patient has undergone recent blood work and this was reviewed today. His blood chemistries are all normal. His lipid panel shows a cholesterol of 188, triglycerides 79, HDL 50, and LDL 122. His CBC shows a normal white blood cell count of 5800, hemoglobin 16.4, hematocrit 48.2, and platelet count 222,000. A chest x-ray showed normal heart size and clear lung fields. However, vascular calcification of the aortic arch was noted.  Outpatient Encounter Prescriptions as of 02/08/2012  Medication Sig Dispense Refill  . aspirin (ADULT ASPIRIN EC LOW STRENGTH) 81 MG EC tablet Take 81 mg by mouth daily.        . sildenafil (VIAGRA) 100 MG tablet Take 1 tablet (100 mg total) by mouth daily as needed.  10 tablet  11  . DISCONTD: peg 3350 powder (MOVIPREP) 100 G SOLR moviprep as directed// no substitutions  1 kit  0    Review of patient's allergies indicates no known allergies.  Past Medical History  Diagnosis Date  . Hyperlipidemia   . GERD (gastroesophageal reflux disease)     Past Surgical History  Procedure Date  . Hernia repair 11/2007    right  . Eye surgery 09/2006    both eyes  . Lumbar lamincectomy 1982    History   Social History  . Marital Status: Married   Spouse Name: N/A    Number of Children: 3  . Years of Education: N/A   Occupational History  . [employer]    Social History Main Topics  . Smoking status: Never Smoker   . Smokeless tobacco: Never Used  . Alcohol Use: No  . Drug Use: No  . Sexually Active: Not on file   Other Topics Concern  . Not on file   Social History Narrative  . No narrative on file    Family History  Problem Relation Age of Onset  . Cancer Mother     breast ca with mets  . Dementia Mother     Alzheimers  . Cancer Father   . Diabetes Sister   . Hyperlipidemia Brother   . Heart disease Brother     quad bypass  . Colon cancer Neg Hx   . Stomach cancer Neg Hx     ROS: General: no fevers/chills/night sweats Eyes: no blurry vision, diplopia, or amaurosis ENT: no sore throat or hearing loss Resp: no cough, wheezing, or hemoptysis CV: no edema or palpitations GI: no abdominal pain, nausea, vomiting, diarrhea, or constipation GU: no dysuria, frequency, or hematuria Skin: no rash Neuro: no headache, numbness, tingling, or weakness of extremities Musculoskeletal: no joint pain or swelling Heme: no bleeding, DVT, or easy bruising Endo: no polydipsia or polyuria  BP 128/80  Pulse 70  Ht 5\' 10"  (1.778 m)  Wt 83.915 kg (185 lb)  BMI 26.54 kg/m2  PHYSICAL  EXAM: Pt is alert and oriented, WD, WN, in no distress. HEENT: normal Neck: JVP normal. Carotid upstrokes normal without bruits. No thyromegaly. Lungs: equal expansion, clear bilaterally CV: Apex is discrete and nondisplaced, RRR without murmur or gallop Abd: soft, NT, +BS, no bruit, no hepatosplenomegaly Back: no CVA tenderness Ext: no C/C/E        Femoral pulses 2+= without bruits        DP/PT pulses intact and = Skin: warm and dry without rash Neuro: CNII-XII intact             Strength intact = bilaterally  EKG:  Normal sinus rhythm with left anterior fascicular block. Heart rate is 60 beats per minute.  ASSESSMENT AND  PLAN: Exertional dyspnea. The symptoms of new-onset or concerning in this patient with no smoking history and no evidence of lung disease on his exam or chest x-ray. This could represent an anginal equivalent. The patient has significant risk considering his family history. I have recommended that he undergo an exercise Myoview stress test to rule out ischemia and evaluate his exercise tolerance. Pending the results of this test, will consider whether an invasive evaluation with cardiac catheterization is required. If the stress test is normal, I will probably order an echocardiogram to rule out other structural heart disease as a cause of his shortness of breath. Will arrange followup after the stress test is completed.  Tonny Bollman 02/08/2012 6:22 PM

## 2012-02-08 NOTE — Patient Instructions (Signed)
Your physician recommends that you schedule a follow-up appointment in: 4-6 WEEKS with Dr Excell Seltzer  Your physician has requested that you have an exercise stress myoview. For further information please visit [email or web address] Please follow instruction sheet, as given.  Your physician recommends that you continue on your current medications as directed. Please refer to the Current Medication list given to you today.

## 2012-02-16 ENCOUNTER — Ambulatory Visit (HOSPITAL_COMMUNITY): Payer: BC Managed Care – PPO | Attending: Cardiovascular Disease | Admitting: Radiology

## 2012-02-16 VITALS — BP 141/84 | HR 48 | Ht 70.0 in | Wt 184.0 lb

## 2012-02-16 DIAGNOSIS — R079 Chest pain, unspecified: Secondary | ICD-10-CM

## 2012-02-16 DIAGNOSIS — I479 Paroxysmal tachycardia, unspecified: Secondary | ICD-10-CM

## 2012-02-16 DIAGNOSIS — R0602 Shortness of breath: Secondary | ICD-10-CM | POA: Insufficient documentation

## 2012-02-16 DIAGNOSIS — I4949 Other premature depolarization: Secondary | ICD-10-CM

## 2012-02-16 HISTORY — PX: CARDIOVASCULAR STRESS TEST: SHX262

## 2012-02-16 MED ORDER — TECHNETIUM TC 99M TETROFOSMIN IV KIT
11.0000 | PACK | Freq: Once | INTRAVENOUS | Status: AC | PRN
  Administered 2012-02-16: 11 via INTRAVENOUS

## 2012-02-16 MED ORDER — REGADENOSON 0.4 MG/5ML IV SOLN
0.4000 mg | Freq: Once | INTRAVENOUS | Status: AC
  Administered 2012-02-16: 0.4 mg via INTRAVENOUS

## 2012-02-16 MED ORDER — TECHNETIUM TC 99M TETROFOSMIN IV KIT
33.0000 | PACK | Freq: Once | INTRAVENOUS | Status: AC | PRN
  Administered 2012-02-16: 33 via INTRAVENOUS

## 2012-02-16 NOTE — Progress Notes (Signed)
[facility] SITE 3 NUCLEAR MED [address] [place] [address] [phone]  Cardiology Nuclear Med Study  [name] [name] is a 61 y.o. male     MRN : [id number]     DOB: 1951/08/07  Procedure Date: 02/16/2012  Nuclear Med Background Indication for Stress Test:  Evaluation for Ischemia and Exercise Tolerance History:  '01 MPS:No ischemia, EF=56%. Cardiac Risk Factors: Family History - CAD and Lipids  Symptoms:  Chest Tightness after Working (last episode of chest discomfort was yesterday), Diaphoresis, DOE/SOB, Fatigue, Fatigue with Exertion, Nausea, Palpitations and Rapid HR    Nuclear Pre-Procedure Caffeine/Decaff Intake:  None> 12 hrs NPO After: 7:00pm   Lungs:  Clear. IV 0.9% NS with Angio Cath:  20g  IV Site: R Antecubital x 1, tolerated well IV Started by:  Irean Hong, RN  Chest Size (in):  42 Cup Size: n/a  Height: 5\' 10"  (1.778 m)  Weight:  184 lb (83.462 kg)  BMI:  Body mass index is 26.40 kg/(m^2). Tech Comments:  n/a    Nuclear Med Study 1 or 2 day study: 1 day  Stress Test Type:  Treadmill/Lexiscan  Reading MD: Charlton Haws, MD  Order Authorizing Provider:  Tonny Bollman, MD  Resting Radionuclide: Technetium 68m Tetrofosmin  Resting Radionuclide Dose: 11.0 mCi   Stress Radionuclide:  Technetium 63m Tetrofosmin  Stress Radionuclide Dose: 33.0 mCi           Stress Protocol Rest HR: 48 Stress HR: 118  Rest BP: 141/84 Stress BP: 190/83  Exercise Time (min): 9:00 METS: 7.0   Predicted Max HR: 159 bpm % Max HR: 74.21 bpm Rate Pressure Product: 21308   Dose of Adenosine (mg):  n/a Dose of Lexiscan: 0.4 mg  Dose of Atropine (mg): n/a Dose of Dobutamine: n/a mcg/kg/min (at max HR)  Stress Test Technologist: Smiley Houseman, CMA-N  Nuclear Technologist:  Domenic Polite, CNMT     Rest Procedure:  Myocardial perfusion imaging was performed at rest 45 minutes following the intravenous administration of Technetium 55m Tetrofosmin.  Rest ECG:  Sinus brady, LVH, LAD.  Stress Procedure: The patient initially walked the treadmill utilizing the Bruce protocol for nine minutes, but was unable to reach his target heart rate.  He did c/o chest tightness, 5-6/10, while exercising on the Bruce protocol.  There were occasional PVC's/PAC's and a 3-4 beat run of nonsustained v-tach.   He then received IV Lexiscan 0.4 mg over 15-seconds with concurrent low level exercise and then Technetium 37m Tetrofosmin was injected at 30-seconds while the patient continued walking one more minute. There were no diagnostic ST-T wave changes with Lexiscan bolus. Quantitative spect images were obtained after a 45-minute delay.  Stress ECG: No significant change from baseline ECG  QPS Raw Data Images:  Normal; no motion artifact; normal heart/lung ratio. Stress Images:  Normal homogeneous uptake in all areas of the myocardium. Rest Images:  Normal homogeneous uptake in all areas of the myocardium. Subtraction (SDS):  Normal Transient Ischemic Dilatation (Normal <1.22):  0.97 Lung/Heart Ratio (Normal <0.45):  0.35  Quantitative Gated Spect Images QGS EDV:  110 ml QGS ESV:  43 ml  Impression Exercise Capacity:  Lexiscan with low level exercise. BP Response:  Normal blood pressure response. Clinical Symptoms:  Mild chest pain/dyspnea. ECG Impression:  No significant ST segment change suggestive of ischemia. Comparison with Prior Nuclear Study: No images to compare  Overall Impression:  Normal stress nuclear study.  LV Ejection Fraction: 61%.  LV Wall  Motion:  NL LV Function; NL Wall Motion   Charlton Haws

## 2012-02-17 ENCOUNTER — Other Ambulatory Visit (HOSPITAL_COMMUNITY): Payer: Self-pay | Admitting: Radiology

## 2012-02-17 ENCOUNTER — Telehealth: Payer: Self-pay | Admitting: Cardiovascular Disease

## 2012-02-17 DIAGNOSIS — R0602 Shortness of breath: Secondary | ICD-10-CM

## 2012-02-17 DIAGNOSIS — R079 Chest pain, unspecified: Secondary | ICD-10-CM

## 2012-02-17 NOTE — Telephone Encounter (Signed)
New Problem:    Patient called in wanting to know the results of his latest stress test.  Please call back. 

## 2012-02-17 NOTE — Progress Notes (Signed)
Nuclear study completed 02/16/12. [name] [name]

## 2012-02-17 NOTE — Telephone Encounter (Signed)
Pt advised of stress test results

## 2012-02-21 ENCOUNTER — Encounter: Payer: BC Managed Care – PPO | Admitting: Gastroenterology

## 2012-02-23 ENCOUNTER — Telehealth: Payer: Self-pay | Admitting: Gastroenterology

## 2012-02-23 NOTE — Telephone Encounter (Signed)
Patient will start the prep as soon as he gets in from work about 6 PM.

## 2012-02-24 ENCOUNTER — Encounter: Payer: Self-pay | Admitting: Gastroenterology

## 2012-02-24 ENCOUNTER — Ambulatory Visit (AMBULATORY_SURGERY_CENTER): Payer: BC Managed Care – PPO | Admitting: Gastroenterology

## 2012-02-24 VITALS — BP 122/82 | HR 62 | Temp 98.0°F | Resp 18 | Ht 70.0 in | Wt 182.0 lb

## 2012-02-24 DIAGNOSIS — K635 Polyp of colon: Secondary | ICD-10-CM

## 2012-02-24 DIAGNOSIS — Z1211 Encounter for screening for malignant neoplasm of colon: Secondary | ICD-10-CM

## 2012-02-24 DIAGNOSIS — D126 Benign neoplasm of colon, unspecified: Secondary | ICD-10-CM

## 2012-02-24 HISTORY — DX: Polyp of colon: K63.5

## 2012-02-24 MED ORDER — SODIUM CHLORIDE 0.9 % IV SOLN: 500.0000 mL | INTRAVENOUS | Status: DC

## 2012-02-24 NOTE — Patient Instructions (Addendum)

## 2012-02-24 NOTE — Op Note (Signed)
[facility] [address] [place], [place]  [address]  COLONOSCOPY PROCEDURE REPORT  PATIENT:  [name] [name], [name] [name]  MR#:  [id number] BIRTHDATE:  1951/03/06, 61 yrs. old  GENDER:  male ENDOSCOPIST:  Barbette Hair. Arlyce Dice, MD REF. BY:  Ruthe Mannan, M.D. PROCEDURE DATE:  02/24/2012 PROCEDURE:  Colon with cold biopsy polypectomy, Colonoscopy with snare polypectomy ASA CLASS:  Class I INDICATIONS:  Elevated Risk Screening MEDICATIONS:   MAC sedation, administered by CRNA propofol 270mg IV  DESCRIPTION OF PROCEDURE:   After the risks benefits and alternatives of the procedure were thoroughly explained, informed consent was obtained.  Digital rectal exam was performed and revealed no abnormalities.   The LB CF-[id number] endoscope was introduced through the anus and advanced to the cecum, which was identified by both the appendix and ileocecal valve, without limitations.  The quality of the prep was excellent, using MoviPrep.  The instrument was then slowly withdrawn as the colon was fully examined. <<PROCEDUREIMAGES>>  FINDINGS:  There were multiple polyps identified and removed. 4 3mm sessile polyps in cecum (1) and ascending colon Polyps were snared without cautery. Retrieval was successful (see image1, image4, image5, and image6). snare polyp  A sessile polyp was found in the descending colon. It was 2 - 3 mm in size (see image7). Removed with combination of cold snare and cold bx forceps  A sessile polyp was found in the sigmoid colon. It was 8 mm in size. It was found 11 cm from the point of entry. Polyp was snared, then cauterized with monopolar cautery. Retrieval was successful (see image8). snare polyp  A diverticulum was found in the cecum (see image3). Single diverticulum  This was otherwise a normal examination of the colon (see image3 and image10). Retroflexed views in the rectum revealed no abnormalities.    The time to cecum =  1) 4.50  minutes. The scope was then  withdrawn in 1) 18.50  minutes from the cecum and the procedure completed. 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 Otherwise you should continue to follow colorectal cancer screening guidelines for "routine risk" patients with a colonoscopy in 10 years. You will receive a letter within 1-2 weeks with the results of your biopsy as well as final recommendations. Please call my office if you have not received a letter after 3 weeks. REPEAT EXAM:  You will receive a letter from Dr. Arlyce Dice in 1-2 weeks, after reviewing the final pathology, with followup recommendations.  ______________________________ Barbette Hair Arlyce Dice, MD  CC:  n. eSIGNED:   Barbette Hair. Lidie Glade at 02/24/2012 10:46 AM  Larey Seat, [phone]

## 2012-02-25 ENCOUNTER — Telehealth: Payer: Self-pay

## 2012-02-25 NOTE — Telephone Encounter (Signed)
  Follow up Call-  Call back number 02/24/2012  Post procedure Call Back phone  # [phone]? See work Database administrator to leave phone message Yes     Patient questions:  Do you have a fever, pain , or abdominal swelling? no Pain Score  0 *  Have you tolerated food without any problems? yes  Have you been able to return to your normal activities? yes  Do you have any questions about your discharge instructions: Diet   no Medications  no Follow up visit  no  Do you have questions or concerns about your Care? no  Actions: * If pain score is 4 or above: No action needed, pain <4.   "I did fine" per the pt. [name]

## 2012-03-02 ENCOUNTER — Encounter: Payer: Self-pay | Admitting: Gastroenterology

## 2012-03-15 ENCOUNTER — Ambulatory Visit (INDEPENDENT_AMBULATORY_CARE_PROVIDER_SITE_OTHER): Payer: BC Managed Care – PPO | Admitting: Cardiovascular Disease

## 2012-03-15 ENCOUNTER — Encounter: Payer: Self-pay | Admitting: Cardiovascular Disease

## 2012-03-15 VITALS — BP 144/94 | HR 69 | Ht 70.0 in | Wt 184.8 lb

## 2012-03-15 DIAGNOSIS — R072 Precordial pain: Secondary | ICD-10-CM

## 2012-03-15 NOTE — Patient Instructions (Addendum)
Please call [phone] if you would like to proceed with cardiac catheterization ([facility] lab).   Your physician wants you to follow-up in: 6 MONTHS with Dr Excell Seltzer.  You will receive a reminder letter in the mail two months in advance. If you don't receive a letter, please call our office to schedule the follow-up appointment.  Your physician recommends that you continue on your current medications as directed. Please refer to the Current Medication list given to you today.

## 2012-03-15 NOTE — Progress Notes (Signed)
   HPI:  61 year old gentleman present for followup evaluation. He was seen June 4 for evaluation of shortness of breath. He also described episodes of chest tightness. His cardiovascular risk factors include strong family history of CAD, vascular calcification visualized on the aortic arch with x-ray imaging, and mild hyperlipidemia. He underwent a nuclear stress test where he exercised for 9 minutes but was unable to reach target heart rate. He was converted to [address]. His stress study was negative for ischemia. His gated left ventricular ejection fraction was 61%.  From a symptomatic perspective, he continues to describe substernal chest pressure with very heavy exertion such as carrying heavy objects or with emotional stress. He denies resting symptoms are in the progression of his symptoms. The patient has exertional dyspnea over the last 6 months as previously described as an initial consult note. There are no other interval changes in his symptoms.  Outpatient Encounter Prescriptions as of 03/15/2012  Medication Sig Dispense Refill  . aspirin (ADULT ASPIRIN EC LOW STRENGTH) 81 MG EC tablet Take 81 mg by mouth daily.        . sildenafil (VIAGRA) 100 MG tablet Take 1 tablet (100 mg total) by mouth daily as needed.  10 tablet  11    No Known Allergies  Past Medical History  Diagnosis Date  . Hyperlipidemia   . GERD (gastroesophageal reflux disease)     ROS: Negative except as per HPI  BP 144/94  Pulse 69  Ht 5\' 10"  (1.778 m)  Wt 83.825 kg (184 lb 12.8 oz)  BMI 26.52 kg/m2  PHYSICAL EXAM: Pt is alert and oriented, NAD HEENT: normal Neck: JVP - normal, carotids 2+= without bruits Lungs: CTA bilaterally CV: RRR without murmur or gallop Abd: soft, NT, Positive BS, no hepatomegaly Ext: no C/C/E, distal pulses intact and equal Skin: warm/dry no rash  ASSESSMENT AND PLAN: 1. Exertional angina. Despite his negative Myoview scan, I am concerned about his symptoms as they are fairly  typical for angina and clearly are associated with exertion. We discussed consideration of cardiac catheterization and he seems a bit reluctant to proceed with this. I carefully reviewed the risks, indications, and alternatives to this approach. He will consider and let us know if he would like to proceed. He will continue on daily aspirin. I offered him a prescription for sublingual nitroglycerin but he declined. I also talked to him about regular use of a beta blocker but he also declined on this.  2. High blood pressure. He reports blood pressure is normally within limits as it was at his last office visit. He rushed in today and attributes it by pressure elevation to that.  3. Followup. We will wait to hear back from Mr. Tigges to see if he decides to proceed with cardiac catheterization.  Tonny Bollman 03/15/2012 11:16 PM

## 2012-10-11 ENCOUNTER — Other Ambulatory Visit: Payer: Self-pay

## 2012-10-11 MED ORDER — SILDENAFIL CITRATE 100 MG PO TABS: 100.0000 mg | ORAL_TABLET | Freq: Every day | ORAL | Status: DC | PRN

## 2012-10-11 NOTE — Telephone Encounter (Signed)
Pt request written rx Viagra. Call when ready for pick up.(pt uses mail order pharmacy in [place]).

## 2012-10-12 MED ORDER — SILDENAFIL CITRATE 100 MG PO TABS: 100.0000 mg | ORAL_TABLET | Freq: Every day | ORAL | Status: DC | PRN

## 2012-10-12 NOTE — Addendum Note (Signed)
Addended by: Eliezer Bottom on: 10/12/2012 08:19 AM   Modules accepted: Orders

## 2012-10-12 NOTE — Telephone Encounter (Signed)
Advised patient script is ready for pick up. 

## 2012-12-07 ENCOUNTER — Encounter: Payer: Self-pay | Admitting: Family Medicine

## 2012-12-07 ENCOUNTER — Ambulatory Visit (INDEPENDENT_AMBULATORY_CARE_PROVIDER_SITE_OTHER): Payer: BC Managed Care – PPO | Admitting: Family Medicine

## 2012-12-07 VITALS — BP 140/90 | HR 68 | Temp 97.7°F | Wt 170.0 lb

## 2012-12-07 DIAGNOSIS — R531 Weakness: Secondary | ICD-10-CM

## 2012-12-07 DIAGNOSIS — R5381 Other malaise: Secondary | ICD-10-CM

## 2012-12-07 LAB — CBC WITH DIFFERENTIAL/PLATELET
Eosinophils Relative: 1.8 % (ref 0.0–5.0)
HCT: 47.2 % (ref 39.0–52.0)
Lymphs Abs: 1.5 10*3/uL (ref 0.7–4.0)
MCHC: 34.5 g/dL (ref 30.0–36.0)
MCV: 94.8 fl (ref 78.0–100.0)
Monocytes Absolute: 0.7 10*3/uL (ref 0.1–1.0)
Platelets: 246 10*3/uL (ref 150.0–400.0)
RDW: 13 % (ref 11.5–14.6)
WBC: 6.5 10*3/uL (ref 4.5–10.5)

## 2012-12-07 LAB — COMPREHENSIVE METABOLIC PANEL
Alkaline Phosphatase: 78 U/L (ref 39–117)
Glucose, Bld: 94 mg/dL (ref 70–99)
Sodium: 136 mEq/L (ref 135–145)
Total Bilirubin: 0.9 mg/dL (ref 0.3–1.2)
Total Protein: 7 g/dL (ref 6.0–8.3)

## 2012-12-07 LAB — TSH: TSH: 0.95 u[IU]/mL (ref 0.35–5.50)

## 2012-12-07 LAB — VITAMIN B12: Vitamin B-12: 423 pg/mL (ref 211–911)

## 2012-12-07 MED ORDER — AMOXICILLIN 875 MG PO TABS: 875.0000 mg | ORAL_TABLET | Freq: Two times a day (BID) | ORAL | Status: DC

## 2012-12-07 NOTE — Patient Instructions (Addendum)
Good to see you. We will call you with your lab results.   
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
## 2012-12-07 NOTE — Addendum Note (Signed)
Addended by: Dianne Dun on: 12/07/2012 12:59 PM   Modules accepted: Orders

## 2012-12-10 ENCOUNTER — Emergency Department (INDEPENDENT_AMBULATORY_CARE_PROVIDER_SITE_OTHER): Payer: BC Managed Care – PPO

## 2012-12-10 ENCOUNTER — Emergency Department (INDEPENDENT_AMBULATORY_CARE_PROVIDER_SITE_OTHER)
Admission: EM | Admit: 2012-12-10 | Discharge: 2012-12-10 | Disposition: A | Discharge status: Home / Self Care | Payer: BC Managed Care – PPO | Source: Home / Self Care | Attending: Emergency Medicine | Admitting: Emergency Medicine

## 2012-12-10 ENCOUNTER — Encounter (HOSPITAL_COMMUNITY): Payer: Self-pay | Admitting: *Deleted

## 2012-12-10 DIAGNOSIS — S7002XA Contusion of left hip, initial encounter: Secondary | ICD-10-CM

## 2012-12-10 DIAGNOSIS — S9000XA Contusion of unspecified ankle, initial encounter: Secondary | ICD-10-CM

## 2012-12-10 DIAGNOSIS — S7000XA Contusion of unspecified hip, initial encounter: Secondary | ICD-10-CM

## 2012-12-10 DIAGNOSIS — S9002XA Contusion of left ankle, initial encounter: Secondary | ICD-10-CM

## 2012-12-10 MED ORDER — IBUPROFEN 800 MG PO TABS
ORAL_TABLET | ORAL | Status: AC
  Filled 2012-12-10: qty 1

## 2012-12-10 MED ORDER — HYDROCODONE-ACETAMINOPHEN 5-325 MG PO TABS
ORAL_TABLET | ORAL | Status: AC
  Filled 2012-12-10: qty 1

## 2012-12-10 MED ORDER — HYDROCODONE-ACETAMINOPHEN 5-325 MG PO TABS: ORAL_TABLET | ORAL | Status: DC

## 2012-12-10 MED ORDER — NAPROXEN 500 MG PO TABS: 500.0000 mg | ORAL_TABLET | Freq: Two times a day (BID) | ORAL | Status: DC

## 2012-12-10 MED ORDER — IBUPROFEN 800 MG PO TABS
800.0000 mg | ORAL_TABLET | Freq: Once | ORAL | Status: AC
  Administered 2012-12-10: 800 mg via ORAL

## 2012-12-10 MED ORDER — HYDROCODONE-ACETAMINOPHEN 5-325 MG PO TABS
1.0000 | ORAL_TABLET | Freq: Once | ORAL | Status: AC
  Administered 2012-12-10: 1 via ORAL
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
## 2012-12-13 NOTE — ED Notes (Signed)
Pt  Presented  Requesting  A  Work  Note    Dr  Lorenz Coaster  Notified        Ok  For  Work  Note  Through  Next  Monday

## 2013-01-31 ENCOUNTER — Ambulatory Visit: Payer: BC Managed Care – PPO | Admitting: Family Medicine

## 2013-01-31 ENCOUNTER — Telehealth: Payer: Self-pay | Admitting: Cardiovascular Disease

## 2013-01-31 NOTE — Telephone Encounter (Signed)
New Problem  Pt states that he was supposed to be cleared for surgery.  He said he can not wait until June when he comes in for his appt. He wants to know if you will contact him for regarding clearing.

## 2013-01-31 NOTE — Telephone Encounter (Signed)
03/15/12 last appointment with Dr Excell Seltzer. Left message for pt to contact the office.

## 2013-02-01 NOTE — Telephone Encounter (Signed)
I spoke with the pt and he said to cancel his appointment with Dr Excell Seltzer on 02/14/13. The pt said that he saw a new Cardiologist yesterday who handled his cardiac clearance.  Appointment cancelled per pt request.

## 2013-02-14 ENCOUNTER — Other Ambulatory Visit: Payer: Self-pay | Admitting: Orthopedic Surgery

## 2013-02-14 ENCOUNTER — Encounter (HOSPITAL_BASED_OUTPATIENT_CLINIC_OR_DEPARTMENT_OTHER): Payer: Self-pay | Admitting: *Deleted

## 2013-02-14 ENCOUNTER — Ambulatory Visit: Payer: BC Managed Care – PPO | Admitting: Cardiovascular Disease

## 2013-02-14 NOTE — Progress Notes (Signed)
NPO AFTER MN WITH EXCEPTION CLEAR LIQUIDS UNTIL 0700 (NO CREAM/ MILK PRODUCTS). ARRIVES AT 1100. NEEDS HG. MAY TAKE HYDROCODONE W/ SIPS OF WATER.

## 2013-02-14 NOTE — H&P (Signed)
[name] [name] is an 62 y.o. male.   Chief Complaint: right knee pain HPI: The patient is a 62 year old male being followed for their right knee pain, now 9 weeks out from injury. Symptoms reported include pain, swelling, locking, giving way, instability and pain with weightbearing. Refractory to: home exercise program, bracing (economy) and NSAIDs (Naproxen), Percocet, steroid injections, relative rest, activity modification.   Past Medical History  Diagnosis Date  . Hyperlipidemia   . Shortness of breath   . Right knee meniscal tear   . Arthritis     KNEES    Past Surgical History  Procedure Laterality Date  . Cardiac catheterization  06-17-2006  DR COOPER    NORMAL CORONARY ARTERIES/ NORMAL LVF  . Cardiovascular stress test  02-16-2012    NORMAL LEXISCAN NUCLEAR STUDY/ EF 61%  . Inguinal hernia repair Right 05-24-2007  . Lumbar fusion  1980'S    L4 -- S1  . Cataract extraction w/ intraocular lens  implant, bilateral      Family History  Problem Relation Age of Onset  . Cancer Mother     breast ca with mets  . Dementia Mother     Alzheimers  . Cancer Father   . Diabetes Sister   . Hyperlipidemia Brother   . Heart disease Brother     quad bypass  . Colon cancer Neg Hx   . Stomach cancer Neg Hx    Social History:  reports that he has never smoked. He has never used smokeless tobacco. He reports that he does not drink alcohol or use illicit drugs.  Allergies: No Known Allergies   (Not in a hospital admission)  No results found for this or any previous visit (from the past 48 hour(s)). No results found.  Review of Systems  Constitutional: Negative.   HENT: Negative.   Eyes: Negative.   Respiratory: Negative.   Cardiovascular: Negative.   Gastrointestinal: Negative.   Genitourinary: Negative.   Musculoskeletal: Positive for joint pain.  Skin: Negative.   Neurological: Negative.   Endo/Heme/Allergies: Negative.   Psychiatric/Behavioral: Negative.     There  were no vitals taken for this visit. Physical Exam  Constitutional: He is oriented to person, place, and time. He appears well-developed and well-nourished.  HENT:  Head: Normocephalic and atraumatic.  Eyes: Conjunctivae and EOM are normal. Pupils are equal, round, and reactive to light.  Neck: Normal range of motion. Neck supple.  Cardiovascular: Normal rate and regular rhythm.   Respiratory: Effort normal and breath sounds normal.  GI: Soft. Bowel sounds are normal.  Musculoskeletal:  On exam, tender in the medial joint line. McMurray's is positive. He's tender over the medial patellar region, though his patella is tracking. I am unable to dislocate it or sublux it. He doesn't have instability with valgus stress.  Antalgic gait with crutches for support  Neurological: He is alert and oriented to person, place, and time. He has normal reflexes.  Skin: Skin is warm and dry.  Psychiatric: He has a normal mood and affect.    MRI 12/2012 with MCL tear, degenerative MMT, chondromalacia patella, medial patellar retinaculum tear  Standing xrays with moderate medial joint space and PF narrowing  Assessment/Plan Right knee MMT, DJD  Status post MCL strain, proximal, medial patellofemoral ligament tear. No current patellar instability. Evidence of laxity of the MCL. He has mechanical symptoms with a possible symptomatic meniscus tear despite rest, activity modification, 9 weeks S/P and therapy.  Dr. Shelle Iron had a long  discussion with the patient concerning the risks and benefits of knee arthroscopy including help from the arthroscopic procedure as well as no help from the arthroscopic procedure or worsening of symptoms. Also discussed infection, DVT, PE, anesthetic complications, etc. Also discussed the possibility of repeat arthroscopic surgery required in the future or total knee replacement. I provided the patient with an illustrated handout and discussed it in detail as well as discussed  the postoperative and perioperative courses and return to functional activities including work. Need for postoperative DVT prophylaxis was discussed as well.  Plan right knee arthroscopy, partial medial meniscectomy, debridement   Aitan Rossbach M. for Dr. Shelle Iron 02/14/2013, 12:33 PM

## 2013-02-16 ENCOUNTER — Ambulatory Visit (HOSPITAL_BASED_OUTPATIENT_CLINIC_OR_DEPARTMENT_OTHER): Payer: BC Managed Care – PPO | Admitting: Anesthesiology

## 2013-02-16 ENCOUNTER — Encounter (HOSPITAL_BASED_OUTPATIENT_CLINIC_OR_DEPARTMENT_OTHER): Payer: Self-pay | Admitting: *Deleted

## 2013-02-16 ENCOUNTER — Ambulatory Visit (HOSPITAL_BASED_OUTPATIENT_CLINIC_OR_DEPARTMENT_OTHER)
Admission: RE | Admit: 2013-02-16 | Discharge: 2013-02-16 | Disposition: A | Discharge status: Home / Self Care | Payer: BC Managed Care – PPO | Source: Ambulatory Visit | Attending: Specialist | Admitting: Specialist

## 2013-02-16 ENCOUNTER — Encounter (HOSPITAL_BASED_OUTPATIENT_CLINIC_OR_DEPARTMENT_OTHER): Payer: Self-pay | Admitting: Anesthesiology

## 2013-02-16 ENCOUNTER — Encounter (HOSPITAL_BASED_OUTPATIENT_CLINIC_OR_DEPARTMENT_OTHER)
Admission: RE | Disposition: A | Discharge status: Home / Self Care | Payer: Self-pay | Source: Ambulatory Visit | Attending: Specialist

## 2013-02-16 DIAGNOSIS — W19XXXA Unspecified fall, initial encounter: Secondary | ICD-10-CM | POA: Insufficient documentation

## 2013-02-16 DIAGNOSIS — E785 Hyperlipidemia, unspecified: Secondary | ICD-10-CM | POA: Insufficient documentation

## 2013-02-16 DIAGNOSIS — IMO0002 Reserved for concepts with insufficient information to code with codable children: Secondary | ICD-10-CM | POA: Insufficient documentation

## 2013-02-16 DIAGNOSIS — S83206S Unspecified tear of unspecified meniscus, current injury, right knee, sequela: Secondary | ICD-10-CM

## 2013-02-16 DIAGNOSIS — S83289A Other tear of lateral meniscus, current injury, unspecified knee, initial encounter: Secondary | ICD-10-CM | POA: Insufficient documentation

## 2013-02-16 DIAGNOSIS — M2240 Chondromalacia patellae, unspecified knee: Secondary | ICD-10-CM | POA: Insufficient documentation

## 2013-02-16 HISTORY — DX: Shortness of breath: R06.02

## 2013-02-16 HISTORY — DX: Unspecified tear of unspecified meniscus, current injury, right knee, initial encounter: S83.206A

## 2013-02-16 HISTORY — DX: Unspecified osteoarthritis, unspecified site: M19.90

## 2013-02-16 HISTORY — PX: KNEE ARTHROSCOPY WITH MEDIAL MENISECTOMY: SHX5651

## 2013-02-16 LAB — POCT HEMOGLOBIN-HEMACUE: Hemoglobin: 16.3 g/dL (ref 13.0–17.0)

## 2013-02-16 SURGERY — ARTHROSCOPY, KNEE, WITH MEDIAL MENISCECTOMY
Anesthesia: General | Site: Knee | Laterality: Right | Wound class: Clean

## 2013-02-16 MED ORDER — LACTATED RINGERS IV SOLN
INTRAVENOUS | Status: DC
  Administered 2013-02-16: 15:00:00 via INTRAVENOUS
  Filled 2013-02-16: qty 1000

## 2013-02-16 MED ORDER — BUPIVACAINE-EPINEPHRINE 0.5% -1:200000 IJ SOLN
INTRAMUSCULAR | Status: DC | PRN
  Administered 2013-02-16: 25 mL

## 2013-02-16 MED ORDER — FENTANYL CITRATE 0.05 MG/ML IJ SOLN
25.0000 ug | INTRAMUSCULAR | Status: DC | PRN
  Administered 2013-02-16 (×2): 50 ug via INTRAVENOUS
  Filled 2013-02-16: qty 1

## 2013-02-16 MED ORDER — ACETAMINOPHEN 10 MG/ML IV SOLN
INTRAVENOUS | Status: DC | PRN
  Administered 2013-02-16: 1000 mg via INTRAVENOUS

## 2013-02-16 MED ORDER — CHLORHEXIDINE GLUCONATE 4 % EX LIQD
60.0000 mL | Freq: Once | CUTANEOUS | Status: DC
  Filled 2013-02-16: qty 60

## 2013-02-16 MED ORDER — HYDROCODONE-ACETAMINOPHEN 5-325 MG PO TABS: 1.0000 | ORAL_TABLET | Freq: Four times a day (QID) | ORAL | Status: DC | PRN

## 2013-02-16 MED ORDER — SODIUM CHLORIDE 0.9 % IR SOLN
Status: DC | PRN
  Administered 2013-02-16: 14:00:00

## 2013-02-16 MED ORDER — ONDANSETRON HCL 4 MG/2ML IJ SOLN
INTRAMUSCULAR | Status: DC | PRN
  Administered 2013-02-16: 4 mg via INTRAVENOUS

## 2013-02-16 MED ORDER — CEFAZOLIN SODIUM-DEXTROSE 2-3 GM-% IV SOLR
2.0000 g | INTRAVENOUS | Status: AC
  Administered 2013-02-16: 2 g via INTRAVENOUS
  Filled 2013-02-16: qty 50

## 2013-02-16 MED ORDER — EPHEDRINE SULFATE 50 MG/ML IJ SOLN
INTRAMUSCULAR | Status: DC | PRN
  Administered 2013-02-16 (×2): 5 mg via INTRAVENOUS

## 2013-02-16 MED ORDER — PROPOFOL 10 MG/ML IV BOLUS
INTRAVENOUS | Status: DC | PRN
  Administered 2013-02-16: 200 mg via INTRAVENOUS

## 2013-02-16 MED ORDER — LACTATED RINGERS IV SOLN
INTRAVENOUS | Status: DC
  Administered 2013-02-16 (×2): via INTRAVENOUS
  Filled 2013-02-16: qty 1000

## 2013-02-16 MED ORDER — OXYCODONE HCL 5 MG PO TABS
5.0000 mg | ORAL_TABLET | Freq: Once | ORAL | Status: AC
  Administered 2013-02-16: 5 mg via ORAL
  Filled 2013-02-16: qty 1

## 2013-02-16 MED ORDER — HYDROMORPHONE HCL PF 1 MG/ML IJ SOLN
0.5000 mg | INTRAMUSCULAR | Status: DC | PRN
  Filled 2013-02-16: qty 1

## 2013-02-16 MED ORDER — DEXAMETHASONE SODIUM PHOSPHATE 4 MG/ML IJ SOLN
INTRAMUSCULAR | Status: DC | PRN
  Administered 2013-02-16: 8 mg via INTRAVENOUS

## 2013-02-16 MED ORDER — LACTATED RINGERS IV SOLN
INTRAVENOUS | Status: DC
  Filled 2013-02-16: qty 1000

## 2013-02-16 MED ORDER — FENTANYL CITRATE 0.05 MG/ML IJ SOLN
INTRAMUSCULAR | Status: DC | PRN
  Administered 2013-02-16 (×3): 25 ug via INTRAVENOUS
  Administered 2013-02-16: 75 ug via INTRAVENOUS
  Administered 2013-02-16 (×2): 25 ug via INTRAVENOUS

## 2013-02-16 MED ORDER — MIDAZOLAM HCL 5 MG/5ML IJ SOLN
INTRAMUSCULAR | Status: DC | PRN
  Administered 2013-02-16: 1 mg via INTRAVENOUS

## 2013-02-16 MED ORDER — LIDOCAINE HCL (CARDIAC) 20 MG/ML IV SOLN
INTRAVENOUS | Status: DC | PRN
  Administered 2013-02-16: 100 mg via INTRAVENOUS

## 2013-02-16 SURGICAL SUPPLY — 42 items
BANDAGE ELASTIC 6 VELCRO ST LF (GAUZE/BANDAGES/DRESSINGS) ×2 IMPLANT
BLADE 4.2CUDA (BLADE) IMPLANT
BLADE CUDA SHAVER 3.5 (BLADE) ×2 IMPLANT
BLADE GREAT WHITE 4.2 (BLADE) IMPLANT
BNDG COHESIVE 4X5 TAN NS LF (GAUZE/BANDAGES/DRESSINGS) ×2 IMPLANT
BOOTIES KNEE HIGH SLOAN (MISCELLANEOUS) ×2 IMPLANT
CANISTER SUCT LVC 12 LTR MEDI- (MISCELLANEOUS) ×2 IMPLANT
CANISTER SUCTION 1200CC (MISCELLANEOUS) IMPLANT
CANISTER SUCTION 2500CC (MISCELLANEOUS) IMPLANT
CANNULA ACUFLEX KIT 5X76 (CANNULA) IMPLANT
CLOTH BEACON ORANGE TIMEOUT ST (SAFETY) ×2 IMPLANT
CUTTER MENISCUS  4.2MM (BLADE)
CUTTER MENISCUS 4.2MM (BLADE) IMPLANT
DRAPE ARTHROSCOPY W/POUCH 114 (DRAPES) ×2 IMPLANT
DRSG PAD ABDOMINAL 8X10 ST (GAUZE/BANDAGES/DRESSINGS) ×2 IMPLANT
DURAPREP 26ML APPLICATOR (WOUND CARE) ×2 IMPLANT
ELECT REM PT RETURN 9FT ADLT (ELECTROSURGICAL)
ELECTRODE REM PT RTRN 9FT ADLT (ELECTROSURGICAL) IMPLANT
GAUZE SPONGE 4X4 12PLY STRL LF (GAUZE/BANDAGES/DRESSINGS) ×2 IMPLANT
GLOVE SURG SS PI 8.0 STRL IVOR (GLOVE) ×2 IMPLANT
GOWN PREVENTION PLUS LG XLONG (DISPOSABLE) ×2 IMPLANT
GOWN STRL REIN XL XLG (GOWN DISPOSABLE) ×2 IMPLANT
IV NS IRRIG 3000ML ARTHROMATIC (IV SOLUTION) ×4 IMPLANT
KNEE WRAP E Z 3 GEL PACK (MISCELLANEOUS) ×2 IMPLANT
MINI VAC (SURGICAL WAND) IMPLANT
NDL SAFETY ECLIPSE 18X1.5 (NEEDLE) ×2 IMPLANT
NEEDLE FILTER BLUNT 18X 1/2SAF (NEEDLE) ×1
NEEDLE FILTER BLUNT 18X1 1/2 (NEEDLE) ×1 IMPLANT
NEEDLE HYPO 18GX1.5 SHARP (NEEDLE) ×2
PACK ARTHROSCOPY DSU (CUSTOM PROCEDURE TRAY) ×2 IMPLANT
PACK BASIN DAY SURGERY FS (CUSTOM PROCEDURE TRAY) ×2 IMPLANT
PADDING CAST COTTON 6X4 STRL (CAST SUPPLIES) ×2 IMPLANT
RESECTOR FULL RADIUS 4.2MM (BLADE) IMPLANT
SET ARTHROSCOPY TUBING (MISCELLANEOUS) ×1
SET ARTHROSCOPY TUBING LN (MISCELLANEOUS) ×1 IMPLANT
SPONGE GAUZE 4X4 12PLY (GAUZE/BANDAGES/DRESSINGS) ×2 IMPLANT
SUT ETHILON 4 0 PS 2 18 (SUTURE) ×2 IMPLANT
SYR 30ML LL (SYRINGE) ×2 IMPLANT
SYRINGE 10CC LL (SYRINGE) ×2 IMPLANT
TOWEL OR 17X24 6PK STRL BLUE (TOWEL DISPOSABLE) ×2 IMPLANT
WAND 90 DEG TURBOVAC W/CORD (SURGICAL WAND) IMPLANT
WATER STERILE IRR 500ML POUR (IV SOLUTION) ×2 IMPLANT

## 2013-02-16 NOTE — Anesthesia Postprocedure Evaluation (Signed)
Anesthesia Post Note  Patient: [name] [name]  Procedure(s) Performed: Procedure(s) (LRB): RIGHT KNEE ARTHROSCOPY WITH PARTIAL MEDIAL AND LATERAL  MENISECTOMY (Right)  Anesthesia type: General  Patient location: PACU  Post pain: Pain level controlled  Post assessment: Post-op Vital signs reviewed  Last Vitals: BP 151/98  Pulse 74  Temp(Src) 36.3 C  Resp 17  Ht 5' 10.5" (1.791 m)  Wt 181 lb (82.101 kg)  BMI 25.6 kg/m2  SpO2 96%  Post vital signs: Reviewed  Level of consciousness: sedated  Complications: No apparent anesthesia complications

## 2013-02-16 NOTE — Brief Op Note (Signed)
02/16/2013  2:08 PM  PATIENT:  [name] [name]  62 y.o. male  PRE-OPERATIVE DIAGNOSIS:  MENISCAL TEAR RIGHT KNEE  POST-OPERATIVE DIAGNOSIS:  MENISCAL TEAR RIGHT KNEE  PROCEDURE:  Procedure(s): RIGHT KNEE ARTHROSCOPY WITH PARTIAL MEDIAL MENISECTOMY (Right)  SURGEON:  Surgeon(s) and Role:    * Javier Docker, MD - Primary  PHYSICIAN ASSISTANT:   ASSISTANTS: none   ANESTHESIA:   general  EBL:  Total I/O In: 200 [I.V.:200] Out: -   BLOOD ADMINISTERED:none  DRAINS: none   LOCAL MEDICATIONS USED:  MARCAINE     SPECIMEN:  No Specimen  DISPOSITION OF SPECIMEN:  N/A  COUNTS:  YES  TOURNIQUET:  * No tourniquets in log *  DICTATION: .Other Dictation: Dictation Number [id number]  PLAN OF CARE: Discharge to home after PACU  PATIENT DISPOSITION:  PACU - hemodynamically stable.   Delay start of Pharmacological VTE agent (>24hrs) due to surgical blood loss or risk of bleeding: no

## 2013-02-16 NOTE — Progress Notes (Signed)
Dr. Council Mechanic paged and informed that pt states no change in pain after 3.5cc's fentanyl in pacu and 5mg  oxycodone. Order received for dilaudid. Pt states he takes 2-3 pain pills at a time at home.. Pt refused dilaudid when he was informed that he would have to stay x1 hr after receiving it. "I'll just go home and take my meds"  Pt cautioned about taking extra pain meds because he had 1 gm tylenol already in the OR.  Pt and wife verbalized their understanding.

## 2013-02-16 NOTE — Anesthesia Preprocedure Evaluation (Addendum)
Anesthesia Evaluation  Patient identified by MRN, date of birth, ID band Patient awake    Reviewed: Allergy & Precautions, H&P , NPO status , Patient's Chart, lab work & pertinent test results  Airway Mallampati: II TM Distance: >3 FB Neck ROM: full    Dental no notable dental hx. (+) Teeth Intact and Dental Advisory Given   Pulmonary shortness of breath and with exertion,  breath sounds clear to auscultation  Pulmonary exam normal       Cardiovascular + DOE Rhythm:regular Rate:Normal  LAFB   Neuro/Psych negative neurological ROS  negative psych ROS   GI/Hepatic negative GI ROS, Neg liver ROS,   Endo/Other  negative endocrine ROS  Renal/GU negative Renal ROS  negative genitourinary   Musculoskeletal   Abdominal   Peds  Hematology negative hematology ROS (+)   Anesthesia Other Findings   Reproductive/Obstetrics negative OB ROS                          Anesthesia Physical Anesthesia Plan  ASA: II  Anesthesia Plan: General   Post-op Pain Management:    Induction: Intravenous  Airway Management Planned: LMA  Additional Equipment:   Intra-op Plan:   Post-operative Plan:   Informed Consent: I have reviewed the patients History and Physical, chart, labs and discussed the procedure including the risks, benefits and alternatives for the proposed anesthesia with the patient or authorized representative who has indicated his/her understanding and acceptance.   Dental Advisory Given  Plan Discussed with: CRNA and Surgeon  Anesthesia Plan Comments:         Anesthesia Quick Evaluation

## 2013-02-16 NOTE — H&P (View-Only) (Signed)
[name] [name] is an 62 y.o. male.   Chief Complaint: right knee pain HPI: The patient is a 62 year old male being followed for their right knee pain, now 9 weeks out from injury. Symptoms reported include pain, swelling, locking, giving way, instability and pain with weightbearing. Refractory to: home exercise program, bracing (economy) and NSAIDs (Naproxen), Percocet, steroid injections, relative rest, activity modification.   Past Medical History  Diagnosis Date  . Hyperlipidemia   . Shortness of breath   . Right knee meniscal tear   . Arthritis     KNEES    Past Surgical History  Procedure Laterality Date  . Cardiac catheterization  06-17-2006  DR COOPER    NORMAL CORONARY ARTERIES/ NORMAL LVF  . Cardiovascular stress test  02-16-2012    NORMAL LEXISCAN NUCLEAR STUDY/ EF 61%  . Inguinal hernia repair Right 05-24-2007  . Lumbar fusion  1980'S    L4 -- S1  . Cataract extraction w/ intraocular lens  implant, bilateral      Family History  Problem Relation Age of Onset  . Cancer Mother     breast ca with mets  . Dementia Mother     Alzheimers  . Cancer Father   . Diabetes Sister   . Hyperlipidemia Brother   . Heart disease Brother     quad bypass  . Colon cancer Neg Hx   . Stomach cancer Neg Hx    Social History:  reports that he has never smoked. He has never used smokeless tobacco. He reports that he does not drink alcohol or use illicit drugs.  Allergies: No Known Allergies   (Not in a hospital admission)  No results found for this or any previous visit (from the past 48 hour(s)). No results found.  Review of Systems  Constitutional: Negative.   HENT: Negative.   Eyes: Negative.   Respiratory: Negative.   Cardiovascular: Negative.   Gastrointestinal: Negative.   Genitourinary: Negative.   Musculoskeletal: Positive for joint pain.  Skin: Negative.   Neurological: Negative.   Endo/Heme/Allergies: Negative.   Psychiatric/Behavioral: Negative.     There  were no vitals taken for this visit. Physical Exam  Constitutional: He is oriented to person, place, and time. He appears well-developed and well-nourished.  HENT:  Head: Normocephalic and atraumatic.  Eyes: Conjunctivae and EOM are normal. Pupils are equal, round, and reactive to light.  Neck: Normal range of motion. Neck supple.  Cardiovascular: Normal rate and regular rhythm.   Respiratory: Effort normal and breath sounds normal.  GI: Soft. Bowel sounds are normal.  Musculoskeletal:  On exam, tender in the medial joint line. McMurray's is positive. He's tender over the medial patellar region, though his patella is tracking. I am unable to dislocate it or sublux it. He doesn't have instability with valgus stress.  Antalgic gait with crutches for support  Neurological: He is alert and oriented to person, place, and time. He has normal reflexes.  Skin: Skin is warm and dry.  Psychiatric: He has a normal mood and affect.    MRI 12/2012 with MCL tear, degenerative MMT, chondromalacia patella, medial patellar retinaculum tear  Standing xrays with moderate medial joint space and PF narrowing  Assessment/Plan Right knee MMT, DJD  Status post MCL strain, proximal, medial patellofemoral ligament tear. No current patellar instability. Evidence of laxity of the MCL. He has mechanical symptoms with a possible symptomatic meniscus tear despite rest, activity modification, 9 weeks S/P and therapy.  Dr. Beane had a long   discussion with the patient concerning the risks and benefits of knee arthroscopy including help from the arthroscopic procedure as well as no help from the arthroscopic procedure or worsening of symptoms. Also discussed infection, DVT, PE, anesthetic complications, etc. Also discussed the possibility of repeat arthroscopic surgery required in the future or total knee replacement. I provided the patient with an illustrated handout and discussed it in detail as well as discussed  the postoperative and perioperative courses and return to functional activities including work. Need for postoperative DVT prophylaxis was discussed as well.  Plan right knee arthroscopy, partial medial meniscectomy, debridement   Jabori Henegar M. for Dr. Beane 02/14/2013, 12:33 PM    

## 2013-02-16 NOTE — Transfer of Care (Signed)
Immediate Anesthesia Transfer of Care Note  Patient: [name] [name]  Procedure(s) Performed: Procedure(s) (LRB): RIGHT KNEE ARTHROSCOPY WITH PARTIAL MEDIAL AND LATERAL  MENISECTOMY (Right)  Patient Location: PACU  Anesthesia Type: General  Level of Consciousness: awake, alert  and oriented  Airway & Oxygen Therapy: Patient Spontanous Breathing and Patient connected to face mask oxygen  Post-op Assessment: Report given to PACU RN and Post -op Vital signs reviewed and stable  Post vital signs: Reviewed and stable  Complications: No apparent anesthesia complications

## 2013-02-16 NOTE — Interval H&P Note (Signed)
History and Physical Interval Note:  02/16/2013 1:11 PM  [name] [name]  has presented today for surgery, with the diagnosis of MENISCAL TEAR RIGHT  The various methods of treatment have been discussed with the patient and family. After consideration of risks, benefits and other options for treatment, the patient has consented to  Procedure(s): RIGHT KNEE ARTHROSCOPY WITH PARTIAL MEDIAL MENISECTOMY (Right) as a surgical intervention .  The patient's history has been reviewed, patient examined, no change in status, stable for surgery.  I have reviewed the patient's chart and labs.  Questions were answered to the patient's satisfaction.     Adonna Horsley C

## 2013-02-16 NOTE — Interval H&P Note (Signed)
History and Physical Interval Note:  02/16/2013 1:12 PM  [name] [name]  has presented today for surgery, with the diagnosis of MENISCAL TEAR RIGHT  The various methods of treatment have been discussed with the patient and family. After consideration of risks, benefits and other options for treatment, the patient has consented to  Procedure(s): RIGHT KNEE ARTHROSCOPY WITH PARTIAL MEDIAL MENISECTOMY (Right) as a surgical intervention .  The patient's history has been reviewed, patient examined, no change in status, stable for surgery.  I have reviewed the patient's chart and labs.  Questions were answered to the patient's satisfaction.     [name] [name]

## 2013-02-17 NOTE — Op Note (Signed)
[name], [name] [name]                 ACCOUNT NO.:  [id number]  MEDICAL RECORD NO.:  [id number]  LOCATION:  [facility]                         FACILITY:  [facility]  PHYSICIAN:  Jene Every, M.D.    DATE OF BIRTH:  Apr 30, 1951  DATE OF PROCEDURE: DATE OF DISCHARGE:  12/10/2012                              OPERATIVE REPORT   PREOPERATIVE DIAGNOSIS:  Medial meniscus tear  left knee.  POSTOPERATIVE DIAGNOSIS:  Grade 3 chondromalacia medial femoral condyle, patella, posterior horn medial meniscus tear, lateral meniscus tear.  PROCEDURE PERFORMED:  Right knee arthroscopy.  Partial medial and lateral meniscectomy.  Chondroplasty of the patella.  Medial femoral condyle.  Exam under anesthesia.  ANESTHESIA:  General.  HISTORY:  This 62 year old fell sustaining an MCL injury which has healed.  He had persistent pain, locking, giving way of the knee.  MRI indicating meniscal tear in the posterior horn.  Also had anterolateral pain, it was indicated for evaluation of meniscus and patellofemoral joint evaluation under anesthesia for any occult instability.  Risks and benefits discussed including bleeding, infection, no change in symptoms, worsening symptoms, need for repeat debridement, DVT, PE, anesthetic complications, etc.  TECHNIQUE:  With the patient in supine position, after induction of adequate general anesthesia, 400 ciprofloxacin.  Right lower extremity was prepped and draped in the usual sterile fashion.  A lateral parapatellar portal was fashioned with a #11 blade.  Ingress cannula atraumatically placed.  Irrigant was utilized to insufflate joint. Under direct visualization, medial parapatellar portal was fashioned with a #11 blade after localization with an 18-gauge needle sparing the medial meniscus.  Noted a significant grade 3 lesion in the medial femoral condyle.  About 2 x 2, this shaver was introduced and a chondroplasty performed to a stable base.  No grade 4 changes were noted.   Tibial plateau had some grade 2 changes, otherwise unremarkable. There was a posterior horn tear of the meniscus, introduced a shaver and shaved it to a stable base.  There was some tearing beneath and posterior to that, it was not unstable to probe palpation.  ACL was unremarkable.  There was a tear in the anterior horn, lateral meniscus.  This was shaved to a stable base.  The remnant meniscus stable to probe palpation.  No significant chondral changes of the lateral femoral condyle.  Suprapatellar pouch revealed extensive grade 3 changes of the patella. Light chondroplasty performed here as with the sulcus.  There was no instability with manipulation of the patella, however and good patellofemoral tracking.  We revisited all compartments.  No further pathology amenable to arthroscopic intervention; therefore, removed all instrumentation.  Portals were closed with 4-0 nylon simple sutures, 0.25% Marcaine with epinephrine was infiltrated into the joint.  Wound was dressed sterilely without difficulty and transported to the recovery in satisfactory condition.  The patient tolerated the procedure well.  No complications.  No assistant.     Jene Every, M.D.     [name]  D:  02/16/2013  T:  02/17/2013  Job:  [id number]

## 2013-02-17 NOTE — Op Note (Deleted)
NAME:  [name] [name], [name] [name]                 ACCOUNT NO.:  [id number]  MEDICAL RECORD NO.:  [id number]  LOCATION:  [facility]                         FACILITY:  [facility]  PHYSICIAN:  Andris Brothers, M.D.    DATE OF BIRTH:  12/17/1950  DATE OF PROCEDURE: DATE OF DISCHARGE:  12/10/2012                              OPERATIVE REPORT   PREOPERATIVE DIAGNOSIS:  Medial meniscus tear  left knee.  POSTOPERATIVE DIAGNOSIS:  Grade 3 chondromalacia medial femoral condyle, patella, posterior horn medial meniscus tear, lateral meniscus tear.  PROCEDURE PERFORMED:  Right knee arthroscopy.  Partial medial and lateral meniscectomy.  Chondroplasty of the patella.  Medial femoral condyle.  Exam under anesthesia.  ANESTHESIA:  General.  HISTORY:  This 62-year-old fell sustaining an MCL injury which has healed.  He had persistent pain, locking, giving way of the knee.  MRI indicating meniscal tear in the posterior horn.  Also had anterolateral pain, it was indicated for evaluation of meniscus and patellofemoral joint evaluation under anesthesia for any occult instability.  Risks and benefits discussed including bleeding, infection, no change in symptoms, worsening symptoms, need for repeat debridement, DVT, PE, anesthetic complications, etc.  TECHNIQUE:  With the patient in supine position, after induction of adequate general anesthesia, 400 ciprofloxacin.  Right lower extremity was prepped and draped in the usual sterile fashion.  A lateral parapatellar portal was fashioned with a #11 blade.  Ingress cannula atraumatically placed.  Irrigant was utilized to insufflate joint. Under direct visualization, medial parapatellar portal was fashioned with a #11 blade after localization with an 18-gauge needle sparing the medial meniscus.  Noted a significant grade 3 lesion in the medial femoral condyle.  About 2 x 2, this shaver was introduced and a chondroplasty performed to a stable base.  No grade 4 changes were noted.   Tibial plateau had some grade 2 changes, otherwise unremarkable. There was a posterior horn tear of the meniscus, introduced a shaver and shaved it to a stable base.  There was some tearing beneath and posterior to that, it was not unstable to probe palpation.  ACL was unremarkable.  There was a tear in the anterior horn, lateral meniscus.  This was shaved to a stable base.  The remnant meniscus stable to probe palpation.  No significant chondral changes of the lateral femoral condyle.  Suprapatellar pouch revealed extensive grade 3 changes of the patella. Light chondroplasty performed here as with the sulcus.  There was no instability with manipulation of the patella, however and good patellofemoral tracking.  We revisited all compartments.  No further pathology amenable to arthroscopic intervention; therefore, removed all instrumentation.  Portals were closed with 4-0 nylon simple sutures, 0.25% Marcaine with epinephrine was infiltrated into the joint.  Wound was dressed sterilely without difficulty and transported to the recovery in satisfactory condition.  The patient tolerated the procedure well.  No complications.  No assistant.     Shalayah Beagley, M.D.     JB/MEDQ  D:  02/16/2013  T:  02/17/2013  Job:  [id number] 

## 2013-02-19 ENCOUNTER — Encounter (HOSPITAL_BASED_OUTPATIENT_CLINIC_OR_DEPARTMENT_OTHER): Payer: Self-pay | Admitting: Specialist

## 2013-03-12 ENCOUNTER — Other Ambulatory Visit: Payer: Self-pay | Admitting: Family Medicine

## 2013-03-12 DIAGNOSIS — Z0000 Encounter for general adult medical examination without abnormal findings: Secondary | ICD-10-CM

## 2013-03-12 DIAGNOSIS — E785 Hyperlipidemia, unspecified: Secondary | ICD-10-CM

## 2013-03-12 DIAGNOSIS — Z125 Encounter for screening for malignant neoplasm of prostate: Secondary | ICD-10-CM

## 2013-03-13 ENCOUNTER — Encounter: Payer: Self-pay | Admitting: Family Medicine

## 2013-03-13 ENCOUNTER — Other Ambulatory Visit (INDEPENDENT_AMBULATORY_CARE_PROVIDER_SITE_OTHER): Payer: BC Managed Care – PPO

## 2013-03-13 DIAGNOSIS — Z125 Encounter for screening for malignant neoplasm of prostate: Secondary | ICD-10-CM

## 2013-03-13 DIAGNOSIS — Z0000 Encounter for general adult medical examination without abnormal findings: Secondary | ICD-10-CM

## 2013-03-13 DIAGNOSIS — E785 Hyperlipidemia, unspecified: Secondary | ICD-10-CM

## 2013-03-13 LAB — COMPREHENSIVE METABOLIC PANEL
ALT: 28 U/L (ref 0–53)
Albumin: 4.1 g/dL (ref 3.5–5.2)
CO2: 33 mEq/L — ABNORMAL HIGH (ref 19–32)
Calcium: 9.2 mg/dL (ref 8.4–10.5)
Chloride: 103 mEq/L (ref 96–112)
GFR: 96.95 mL/min (ref >60.00)
Glucose, Bld: 93 mg/dL (ref 70–99)
Sodium: 139 mEq/L (ref 135–145)
Total Bilirubin: 1.2 mg/dL (ref 0.3–1.2)
Total Protein: 6.7 g/dL (ref 6.0–8.3)

## 2013-03-13 LAB — LIPID PANEL
Cholesterol: 190 mg/dL (ref 0–200)
VLDL: 15.8 mg/dL (ref 0.0–40.0)

## 2013-03-13 LAB — PSA: PSA: 6.3 ng/mL — ABNORMAL HIGH (ref 0.10–4.00)

## 2013-03-19 ENCOUNTER — Ambulatory Visit (INDEPENDENT_AMBULATORY_CARE_PROVIDER_SITE_OTHER): Payer: BC Managed Care – PPO | Admitting: Family Medicine

## 2013-03-19 ENCOUNTER — Encounter: Payer: Self-pay | Admitting: Family Medicine

## 2013-03-19 VITALS — BP 132/90 | HR 68 | Temp 98.0°F | Ht 69.0 in | Wt 177.0 lb

## 2013-03-19 DIAGNOSIS — G250 Essential tremor: Secondary | ICD-10-CM | POA: Insufficient documentation

## 2013-03-19 DIAGNOSIS — R972 Elevated prostate specific antigen [PSA]: Secondary | ICD-10-CM

## 2013-03-19 DIAGNOSIS — Z0000 Encounter for general adult medical examination without abnormal findings: Secondary | ICD-10-CM

## 2013-03-19 DIAGNOSIS — G252 Other specified forms of tremor: Secondary | ICD-10-CM

## 2013-03-19 DIAGNOSIS — E785 Hyperlipidemia, unspecified: Secondary | ICD-10-CM

## 2013-03-19 DIAGNOSIS — R0602 Shortness of breath: Secondary | ICD-10-CM

## 2013-03-19 DIAGNOSIS — F528 Other sexual dysfunction not due to a substance or known physiological condition: Secondary | ICD-10-CM

## 2013-03-19 MED ORDER — PROPRANOLOL HCL 40 MG PO TABS: 40.0000 mg | ORAL_TABLET | Freq: Two times a day (BID) | ORAL | Status: DC

## 2013-03-19 NOTE — Progress Notes (Signed)
62 yo pleasant male here for CPX.    Doing well.  Had right meniscal repair by Dr. Shelle Iron last month.  Already back at work.  Essential tremor- left arm.  He has had it for years but feels it is getting worse.  Has never tried a betablocker for it.  Elevated PSA- denies any increased difficulty starting or stopping urinary stream.  In fact, never wakes up at night to urinate.  No family h/o prostate CA. Lab Results  Component Value Date   PSA 6.30* 03/13/2013   PSA 3.67 01/19/2012   PSA 2.92 01/25/2011   H/o persistent DOE- referred back to cardiology.  Was cleared.  HLD- Lab Results  Component Value Date   CHOL 190 03/13/2013   HDL 66.50 03/13/2013   LDLCALC 108* 03/13/2013   LDLDIRECT 156.1 01/23/2010   TRIG 79.0 03/13/2013   CHOLHDL 3 03/13/2013   Patient Active Problem List   Diagnosis Date Noted  . Routine general medical examination at a health care facility 01/25/2011  . Fatigue 01/25/2011  . HYPERLIPIDEMIA 12/21/2007  . ERECTILE DYSFUNCTION 12/21/2007  . CATARACT, LEFT EYE 12/21/2007  . INGUINAL HERNIA, RIGHT 04/18/2007   Past Medical History  Diagnosis Date  . Hyperlipidemia   . Shortness of breath   . Right knee meniscal tear   . Arthritis     KNEES   Past Surgical History  Procedure Laterality Date  . Cardiac catheterization  06-17-2006  DR COOPER    NORMAL CORONARY ARTERIES/ NORMAL LVF  . Cardiovascular stress test  02-16-2012    NORMAL LEXISCAN NUCLEAR STUDY/ EF 61%  . Inguinal hernia repair Right 05-24-2007  . Lumbar fusion  1980'S    L4 -- S1  . Cataract extraction w/ intraocular lens  implant, bilateral    . Knee arthroscopy with medial menisectomy Right 02/16/2013    Procedure: RIGHT KNEE ARTHROSCOPY WITH PARTIAL MEDIAL AND LATERAL  MENISECTOMY;  Surgeon: Javier Docker, MD;  Location: [facility];  Service: Orthopedics;  Laterality: Right;   History  Substance Use Topics  . Smoking status: Never Smoker   . Smokeless tobacco: Never Used  .  Alcohol Use: No   Family History  Problem Relation Age of Onset  . Cancer Mother     breast ca with mets  . Dementia Mother     Alzheimers  . Cancer Father   . Diabetes Sister   . Hyperlipidemia Brother   . Heart disease Brother     quad bypass  . Colon cancer Neg Hx   . Stomach cancer Neg Hx    No Known Allergies Current Outpatient Prescriptions on File Prior to Visit  Medication Sig Dispense Refill  . aspirin (ADULT ASPIRIN EC LOW STRENGTH) 81 MG EC tablet Take 81 mg by mouth daily.       Marland Kitchen HYDROcodone-acetaminophen (NORCO) 5-325 MG per tablet Take 1-2 tablets by mouth every 6 (six) hours as needed for pain. MAXIMUM TOTAL ACETAMINOPHEN DOSE IS 4000 MG PER DAY  30 tablet  0  . HYDROcodone-acetaminophen (NORCO/VICODIN) 5-325 MG per tablet 1 to 2 tabs every 4 to 6 hours as needed for pain.  20 tablet  0  . Multiple Minerals-Vitamins (NUTRA-SUPPORT BONE PO) Take by mouth daily. LIQUID FORM       No current facility-administered medications on file prior to visit.     The PMH, PSH, Social History, Family History, Medications, and allergies have been reviewed in [facility], and have been updated if relevant.  Review of Systems       See HPI Patient reports no  vision/ hearing changes,anorexia, weight change, fever ,adenopathy, persistant / recurrent hoarseness, swallowing issues, chest pain, edema,persistant / recurrent cough, hemoptysis, dyspnea(rest, exertional, paroxysmal nocturnal), gastrointestinal  bleeding (melena, rectal bleeding), abdominal pain, excessive heart burn, GU symptoms(dysuria, hematuria, pyuria, voiding/incontinence  Issues) syncope, focal weakness, severe memory loss, concerning skin lesions, depression, anxiety, abnormal bruising/bleeding, major joint swelling.     Physical Exam BP 132/90  Pulse 68  Temp(Src) 98 F (36.7 C)  Ht 5\' 9"  (1.753 m)  Wt 177 lb (80.287 kg)  BMI 26.13 kg/m2 General:  pleasant male in NAD Eyes:  PERRL Ears:  External ear exam shows  no significant lesions or deformities.  Otoscopic examination reveals clear canals, tympanic membranes are intact bilaterally without bulging, retraction, inflammation or discharge. Hearing is grossly normal bilaterally. Nose:  External nasal examination shows no deformity or inflammation. Nasal mucosa are pink and moist without lesions or exudates. Mouth:  Oral mucosa and oropharynx without lesions or exudates.  Teeth in good repair. Neck:  no carotid bruit or thyromegaly no cervical or supraclavicular lymphadenopathy  Lungs:  Normal respiratory effort, chest expands symmetrically. Lungs are clear to auscultation, no crackles or wheezes. Heart:  Normal rate and regular rhythm. S1 and S2 normal without gallop, murmur, click, rub or other extra sounds. Abdomen:  Bowel sounds positive,abdomen soft and non-tender without masses, organomegaly or hernias noted. Pulses:  R and L posterior tibial pulses are full and equal bilaterally  Extremities:  no edema   Assessment and Plan:  1. SOB (shortness of breath)  Resolved.  2. Routine general medical examination at a health care facility Reviewed preventive care protocols, scheduled due services, and updated immunizations Discussed nutrition, exercise, diet, and healthy lifestyle. He will call [employer] about coverage for Zostavax.  3. HYPERLIPIDEMIA Well controlled.  4. ERECTILE DYSFUNCTION Stable.  5. Elevated PSA, less than 10 ng/ml The natural history of prostate cancer and ongoing controversy regarding screening and potential treatment outcomes of prostate cancer has been discussed with the patient. The meaning of a false positive PSA and a false negative PSA has been discussed. He indicates understanding of the limitations of this screening test and wishes to proceed with urology referral. Prostate exam deferred today since he is being referred to urology.  - Ambulatory referral to Urology  6. Essential tremor Deteriorated.  Will try low  dose propranolol.  Follow up in 2 weeks. The patient indicates understanding of these issues and agrees with the plan.

## 2013-03-19 NOTE — Patient Instructions (Addendum)
Great to see you.  Please stop by to see Shirlee Limerick on your way out to set up your referral.  Check with your insurance to see if they will cover the shingles shot.  We are starting propranolol 40 mg twice daily for your essential tremor.  Please come see me in 2 weeks for a follow up.

## 2013-03-30 ENCOUNTER — Other Ambulatory Visit: Payer: Self-pay | Admitting: Family Medicine

## 2013-04-02 ENCOUNTER — Ambulatory Visit: Payer: BC Managed Care – PPO | Admitting: Family Medicine

## 2013-04-25 ENCOUNTER — Telehealth: Payer: Self-pay

## 2013-04-25 NOTE — Telephone Encounter (Signed)
Pt request date of last CPX for pts ins. Co. Advised pt 03/19/13. Pt wanted to let Dr Dayton Martes know that pt saw urologist and was told to wait 6 months and then repeat lab work. Pt would like to speak with Dr Dayton Martes; [phone].

## 2013-04-25 NOTE — Telephone Encounter (Signed)
Returned patient's call.  Discussed urologist findings with me.  Advised follow up labs in 6 months.

## 2013-05-10 ENCOUNTER — Other Ambulatory Visit: Payer: BC Managed Care – PPO

## 2013-05-17 ENCOUNTER — Encounter: Payer: BC Managed Care – PPO | Admitting: Family Medicine
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
## 2013-07-12 ENCOUNTER — Other Ambulatory Visit: Payer: Self-pay

## 2013-08-16 ENCOUNTER — Encounter: Payer: Self-pay | Admitting: *Deleted

## 2013-08-16 ENCOUNTER — Encounter: Payer: Self-pay | Admitting: Internal Medicine

## 2013-08-16 ENCOUNTER — Ambulatory Visit (INDEPENDENT_AMBULATORY_CARE_PROVIDER_SITE_OTHER): Payer: BC Managed Care – PPO | Admitting: Internal Medicine

## 2013-08-16 VITALS — BP 146/90 | HR 106 | Temp 98.1°F | Ht 69.0 in | Wt 171.0 lb

## 2013-08-16 DIAGNOSIS — R509 Fever, unspecified: Secondary | ICD-10-CM

## 2013-08-16 DIAGNOSIS — J209 Acute bronchitis, unspecified: Secondary | ICD-10-CM

## 2013-08-16 LAB — POCT INFLUENZA A/B
Influenza A, POC: NEGATIVE
Influenza B, POC: NEGATIVE

## 2013-08-16 MED ORDER — LEVOFLOXACIN 500 MG PO TABS: 500.0000 mg | ORAL_TABLET | Freq: Every day | ORAL | Status: DC

## 2013-08-16 NOTE — Patient Instructions (Signed)
Acute Bronchitis Bronchitis is inflammation of the airways that extend from the windpipe into the lungs (bronchi). The inflammation often causes mucus to develop. This leads to a cough, which is the most common symptom of bronchitis.  In acute bronchitis, the condition usually develops suddenly and goes away over time, usually in a couple weeks. Smoking, allergies, and asthma can make bronchitis worse. Repeated episodes of bronchitis may cause further lung problems.  CAUSES Acute bronchitis is most often caused by the same virus that causes a cold. The virus can spread from person to person (contagious).  SIGNS AND SYMPTOMS   Cough.   Fever.   Coughing up mucus.   Body aches.   Chest congestion.   Chills.   Shortness of breath.   Sore throat.  DIAGNOSIS  Acute bronchitis is usually diagnosed through a physical exam. Tests, such as chest X-rays, are sometimes done to rule out other conditions.  TREATMENT  Acute bronchitis usually goes away in a couple weeks. Often times, no medical treatment is necessary. Medicines are sometimes given for relief of fever or cough. Antibiotics are usually not needed but may be prescribed in certain situations. In some cases, an inhaler may be recommended to help reduce shortness of breath and control the cough. A cool mist vaporizer may also be used to help thin bronchial secretions and make it easier to clear the chest.  HOME CARE INSTRUCTIONS  Get plenty of rest.   Drink enough fluids to keep your urine clear or pale yellow (unless you have a medical condition that requires fluid restriction). Increasing fluids may help thin your secretions and will prevent dehydration.   Only take over-the-counter or prescription medicines as directed by your health care provider.   Avoid smoking and secondhand smoke. Exposure to cigarette smoke or irritating chemicals will make bronchitis worse. If you are a smoker, consider using nicotine gum or skin  patches to help control withdrawal symptoms. Quitting smoking will help your lungs heal faster.   Reduce the chances of another bout of acute bronchitis by washing your hands frequently, avoiding people with cold symptoms, and trying not to touch your hands to your mouth, nose, or eyes.   Follow up with your health care provider as directed.  SEEK MEDICAL CARE IF: Your symptoms do not improve after 1 week of treatment.  SEEK IMMEDIATE MEDICAL CARE IF:  You develop an increased fever or chills.   You have chest pain.   You have severe shortness of breath.  You have bloody sputum.   You develop dehydration.  You develop fainting.  You develop repeated vomiting.  You develop a severe headache. MAKE SURE YOU:   Understand these instructions.  Will watch your condition.  Will get help right away if you are not doing well or get worse. Document Released: 09/30/2004 Document Revised: 04/25/2013 Document Reviewed: 02/13/2013 Exit[employer] Patient Information 2014 [employer].  

## 2013-08-16 NOTE — Progress Notes (Signed)
HPI  Pt presents to the clinic today with c/o fever, chills, cough, headache, nausea, vomiting. He denies diarrhea. These symptoms started about 1 week ago. He has tried tylenol, mucinex, nyquil which has not provided any relief. He has no history of allergies or asthma. He has had sick contacts.  Review of Systems      Past Medical History  Diagnosis Date  . Hyperlipidemia   . Shortness of breath   . Right knee meniscal tear   . Arthritis     KNEES    Family History  Problem Relation Age of Onset  . Cancer Mother     breast ca with mets  . Dementia Mother     Alzheimers  . Cancer Father   . Diabetes Sister   . Hyperlipidemia Brother   . Heart disease Brother     quad bypass  . Colon cancer Neg Hx   . Stomach cancer Neg Hx     History   Social History  . Marital Status: Married    Spouse Name: N/A    Number of Children: 3  . Years of Education: N/A   Occupational History  . [employer]    Social History Main Topics  . Smoking status: Never Smoker   . Smokeless tobacco: Never Used  . Alcohol Use: No  . Drug Use: No  . Sexual Activity: Not on file   Other Topics Concern  . Not on file   Social History Narrative  . No narrative on file    No Known Allergies   Constitutional: Positive headache, fatigue and fever. Denies abrupt weight changes.  HEENT:  Positive sore throat. Denies eye redness, eye pain, pressure behind the eyes, facial pain, nasal congestion, ear pain, ringing in the ears, wax buildup, runny nose or bloody nose. Respiratory: Positive cough. Denies difficulty breathing or shortness of breath.  Cardiovascular: Denies chest pain, chest tightness, palpitations or swelling in the hands or feet.   No other specific complaints in a complete review of systems (except as listed in HPI above).  Objective:   BP 146/90  Pulse 106  Temp(Src) 98.1 F (36.7 C) (Oral)  Ht 5\' 9"  (1.753 m)  Wt 171 lb (77.565 kg)  BMI 25.24 kg/m2  SpO2  96% Wt Readings from Last 3 Encounters:  08/16/13 171 lb (77.565 kg)  03/19/13 177 lb (80.287 kg)  02/16/13 181 lb (82.101 kg)     General: Appears his stated age, well developed, well nourished in NAD. HEENT: Head: normal shape and size; Eyes: sclera white, no icterus, conjunctiva pink, PERRLA and EOMs intact; Ears: Tm's gray and intact, normal light reflex; Nose: mucosa pink and moist, septum midline; Throat/Mouth: + PND. Teeth present, mucosa erythematous and moist, no exudate noted, no lesions or ulcerations noted.  Neck: Mild cervical lymphadenopathy. Neck supple, trachea midline. No massses, lumps or thyromegaly present.  Cardiovascular: Normal rate and rhythm. S1,S2 noted.  No murmur, rubs or gallops noted. No JVD or BLE edema. No carotid bruits noted. Pulmonary/Chest: Normal effort scattered rhonchi throughout. No respiratory distress. No wheezes, rales or ronchi noted.      Assessment & Plan:   Acute Bronchitis  Rapid Flu negative Get some rest and drink plenty of water Do salt water gargles for the sore throat eRx for Levaquin x 7 days Delsym OTC cough syrup  RTC as needed or if symptoms persist or worsen by Monday   RTC as needed or if symptoms persist.

## 2013-08-16 NOTE — Progress Notes (Signed)
Pre-visit discussion using our clinic review tool. No additional management support is needed unless otherwise documented below in the visit note.  

## 2013-09-14 ENCOUNTER — Other Ambulatory Visit (INDEPENDENT_AMBULATORY_CARE_PROVIDER_SITE_OTHER): Payer: BC Managed Care – PPO

## 2013-09-14 ENCOUNTER — Telehealth: Payer: Self-pay

## 2013-09-14 ENCOUNTER — Other Ambulatory Visit: Payer: Self-pay | Admitting: Family Medicine

## 2013-09-14 DIAGNOSIS — Z0000 Encounter for general adult medical examination without abnormal findings: Secondary | ICD-10-CM

## 2013-09-14 DIAGNOSIS — R972 Elevated prostate specific antigen [PSA]: Secondary | ICD-10-CM

## 2013-09-14 DIAGNOSIS — E785 Hyperlipidemia, unspecified: Secondary | ICD-10-CM

## 2013-09-14 LAB — PSA: PSA: 4.07 ng/mL — ABNORMAL HIGH (ref 0.10–4.00)

## 2013-09-14 LAB — COMPREHENSIVE METABOLIC PANEL
ALBUMIN: 4.1 g/dL (ref 3.5–5.2)
ALK PHOS: 71 U/L (ref 39–117)
ALT: 27 U/L (ref 0–53)
AST: 23 U/L (ref 0–37)
BUN: 17 mg/dL (ref 6–23)
CO2: 29 mEq/L (ref 19–32)
Calcium: 9.5 mg/dL (ref 8.4–10.5)
Chloride: 105 mEq/L (ref 96–112)
Creatinine, Ser: 1 mg/dL (ref 0.4–1.5)
GFR: 85.13 mL/min (ref >60.00)
Glucose, Bld: 95 mg/dL (ref 70–99)
POTASSIUM: 4.3 meq/L (ref 3.5–5.1)
SODIUM: 140 meq/L (ref 135–145)
TOTAL PROTEIN: 7 g/dL (ref 6.0–8.3)
Total Bilirubin: 0.8 mg/dL (ref 0.3–1.2)

## 2013-09-14 LAB — LIPID PANEL
CHOL/HDL RATIO: 4
Cholesterol: 233 mg/dL — ABNORMAL HIGH (ref 0–200)
HDL: 53.5 mg/dL (ref >39.00)
Triglycerides: 85 mg/dL (ref 0.0–149.0)
VLDL: 17 mg/dL (ref 0.0–40.0)

## 2013-09-14 LAB — LDL CHOLESTEROL, DIRECT: Direct LDL: 155.5 mg/dL

## 2013-09-14 NOTE — Telephone Encounter (Signed)
Unfortunately he would need to be seen.  Bronchitis is often viral and abx often are ineffective in these situations.  I would be happy to send in rx for cough suppressant if he is interested.

## 2013-09-14 NOTE — Telephone Encounter (Signed)
Pt left note; pt seen 08/2013 with bronchitis; pt said symptoms went away when seen in Dec but pt works outside and for 1 week has chest congestion, prod cough with yellow phlegm and some wheezing. No fever, SOB or CP. Pt request antibiotic; advised pt needed to be seen. Offered pt appt this AM but pt said he cannot leave work and advised Sat clinic available. Pt requested note sent to Dr Deborra Medina for antibiotic to [facility].Pt request cb.

## 2013-09-14 NOTE — Telephone Encounter (Signed)
Spoke to pt and advised. States that he is not wanting a cough depressant at this time, but will schedule an ov is cough is persistent

## 2013-09-18 ENCOUNTER — Encounter: Payer: Self-pay | Admitting: Family Medicine

## 2013-09-24 NOTE — Telephone Encounter (Signed)
Pt called for lab results and does not have computer access. Patient notified as instructed by telephone.Pt said he is not on special diet; pt eats what is available.

## 2013-10-29 ENCOUNTER — Encounter: Payer: Self-pay | Admitting: Internal Medicine

## 2013-10-29 ENCOUNTER — Ambulatory Visit (INDEPENDENT_AMBULATORY_CARE_PROVIDER_SITE_OTHER): Payer: BC Managed Care – PPO | Admitting: Internal Medicine

## 2013-10-29 VITALS — BP 122/84 | HR 91 | Temp 98.8°F | Wt 177.0 lb

## 2013-10-29 DIAGNOSIS — R509 Fever, unspecified: Secondary | ICD-10-CM

## 2013-10-29 DIAGNOSIS — Z20828 Contact with and (suspected) exposure to other viral communicable diseases: Secondary | ICD-10-CM

## 2013-10-29 DIAGNOSIS — J111 Influenza due to unidentified influenza virus with other respiratory manifestations: Secondary | ICD-10-CM

## 2013-10-29 LAB — POCT INFLUENZA A/B
INFLUENZA A, POC: NEGATIVE
Influenza B, POC: NEGATIVE

## 2013-10-29 MED ORDER — OSELTAMIVIR PHOSPHATE 75 MG PO CAPS: 75.0000 mg | ORAL_CAPSULE | Freq: Two times a day (BID) | ORAL | Status: DC

## 2013-10-29 NOTE — Progress Notes (Signed)
Pre visit review using our clinic review tool, if applicable. No additional management support is needed unless otherwise documented below in the visit note. 

## 2013-10-29 NOTE — Patient Instructions (Addendum)

## 2013-10-29 NOTE — Progress Notes (Signed)
HPI  Pt presents to the clinic today with c/o cough, chest congestion, headache, body aches and shortness of breath. He reports this started 2 days ago. He reports that he is coughing up thick green/white mucous. He has not taken anything OTC. He has had sick contacts. He has no history of allergies or breathing problems. He did not get a flu shot.  Review of Systems      Past Medical History  Diagnosis Date  . Hyperlipidemia   . Shortness of breath   . Right knee meniscal tear   . Arthritis     KNEES    Family History  Problem Relation Age of Onset  . Cancer Mother     breast ca with mets  . Dementia Mother     Alzheimers  . Cancer Father   . Diabetes Sister   . Hyperlipidemia Brother   . Heart disease Brother     quad bypass  . Colon cancer Neg Hx   . Stomach cancer Neg Hx     History   Social History  . Marital Status: Married    Spouse Name: N/A    Number of Children: 3  . Years of Education: N/A   Occupational History  . [employer]    Social History Main Topics  . Smoking status: Never Smoker   . Smokeless tobacco: Never Used  . Alcohol Use: No  . Drug Use: No  . Sexual Activity: Not on file   Other Topics Concern  . Not on file   Social History Narrative  . No narrative on file    No Known Allergies   Constitutional: Positive headache, fatigue. Denies fever or abrupt weight changes.  HEENT:  Denies eye redness, eye pain, pressure behind the eyes, facial pain, nasal congestion, ear pain, ringing in the ears, wax buildup, runny nose or bloody nose. Respiratory: Positive cough. Denies difficulty breathing or shortness of breath.  Cardiovascular: Denies chest pain, chest tightness, palpitations or swelling in the hands or feet.   No other specific complaints in a complete review of systems (except as listed in HPI above).  Objective:   BP 122/84  Pulse 91  Temp(Src) 98.8 F (37.1 C) (Oral)  Wt 177 lb (80.287 kg)  SpO2 97% Wt  Readings from Last 3 Encounters:  10/29/13 177 lb (80.287 kg)  08/16/13 171 lb (77.565 kg)  03/19/13 177 lb (80.287 kg)     General: Appears his stated age, well developed, well nourished in NAD. HEENT: Head: normal shape and size; Eyes: sclera white, no icterus, conjunctiva pink, PERRLA and EOMs intact; Ears: Tm's gray and intact, normal light reflex; Nose: mucosa pink and moist, septum midline; Throat/Mouth: + PND. Teeth present, mucosa erythematous and moist, no exudate noted, no lesions or ulcerations noted.  Neck: Mild cervical lymphadenopathy. Neck supple, trachea midline. No massses, lumps or thyromegaly present.  Cardiovascular: Normal rate and rhythm. S1,S2 noted.  No murmur, rubs or gallops noted. No JVD or BLE edema. No carotid bruits noted. Pulmonary/Chest: Normal effort and positive vesicular breath sounds. No respiratory distress. No wheezes, rales or ronchi noted.      Assessment & Plan:   Influenza:  Get some rest and drink plenty of water Rapid Flu: positive eRx for Tamiflu 75 mg BID x 5 days  RTC as needed or if symptoms persist.

## 2013-10-29 NOTE — Addendum Note (Signed)
Addended by: Lurlean Nanny on: 10/29/2013 04:35 PM   Modules accepted: Orders

## 2013-10-30 ENCOUNTER — Ambulatory Visit: Payer: BC Managed Care – PPO | Admitting: Family Medicine

## 2013-12-21 ENCOUNTER — Encounter: Payer: Self-pay | Admitting: Family Medicine

## 2013-12-21 ENCOUNTER — Ambulatory Visit (INDEPENDENT_AMBULATORY_CARE_PROVIDER_SITE_OTHER): Payer: BC Managed Care – PPO | Admitting: Family Medicine

## 2013-12-21 ENCOUNTER — Ambulatory Visit (INDEPENDENT_AMBULATORY_CARE_PROVIDER_SITE_OTHER)
Admission: RE | Admit: 2013-12-21 | Discharge: 2013-12-21 | Disposition: A | Discharge status: Home / Self Care | Payer: BC Managed Care – PPO | Source: Ambulatory Visit | Attending: Family Medicine | Admitting: Family Medicine

## 2013-12-21 VITALS — BP 132/76 | HR 76 | Temp 98.0°F | Wt 184.5 lb

## 2013-12-21 DIAGNOSIS — M5412 Radiculopathy, cervical region: Secondary | ICD-10-CM

## 2013-12-21 DIAGNOSIS — G542 Cervical root disorders, not elsewhere classified: Secondary | ICD-10-CM

## 2013-12-21 MED ORDER — SILDENAFIL CITRATE 100 MG PO TABS: ORAL_TABLET | ORAL | Status: DC

## 2013-12-21 MED ORDER — DEXAMETHASONE SODIUM PHOSPHATE 10 MG/ML IJ SOLN
10.0000 mg | Freq: Once | INTRAMUSCULAR | Status: AC
  Administered 2013-12-21: 10 mg via INTRAVENOUS

## 2013-12-21 NOTE — Assessment & Plan Note (Signed)
Decadron IM today to decrease inflammation.  He is not taking NSAIDs currently and I advised him not to take them for next 72 hours to decrease risk of GI bleed. Xray today.  May need MRI. The patient indicates understanding of these issues and agrees with the plan.

## 2013-12-21 NOTE — Progress Notes (Signed)
   Subjective:   Patient ID: [name] [name], male    DOB: 1951/04/29, 63 y.o.   MRN: [id number]  [name] [name] is a pleasant 63 y.o. year old male who presents to clinic today with Neck Pain  on 12/21/2013  HPI: Over 3-4 months of severe left sided neck pain that radiates to his shoulder with certain head movements. So severe "almost makes me pee on myself."  Very sharp and shooting.  No known trauma or injury.  Sleeps on his back.  Never radiates to arm or fingers. No UE weakness.  Patient Active Problem List   Diagnosis Date Noted  . 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 Elevated PSA, less than 10 ng/ml 03/19/2013  . Essential tremor 03/19/2013  . HYPERLIPIDEMIA 12/21/2007  . ERECTILE DYSFUNCTION 12/21/2007  . CATARACT, LEFT EYE 12/21/2007  . INGUINAL HERNIA, RIGHT 04/18/2007   Past Medical History  Diagnosis Date  . Hyperlipidemia   . Shortness of breath   . Right knee meniscal tear   . Arthritis     KNEES   Past Surgical History  Procedure Laterality Date  . Cardiac catheterization  06-17-2006  DR COOPER    NORMAL CORONARY ARTERIES/ NORMAL LVF  . Cardiovascular stress test  02-16-2012    NORMAL LEXISCAN NUCLEAR STUDY/ EF 61%  . Inguinal hernia repair Right 05-24-2007  . Lumbar fusion  1980'S    L4 -- S1  . Cataract extraction w/ intraocular lens  implant, bilateral    . Knee arthroscopy with medial menisectomy Right 02/16/2013    Procedure: RIGHT KNEE ARTHROSCOPY WITH PARTIAL MEDIAL AND LATERAL  MENISECTOMY;  Surgeon: Johnn Hai, MD;  Location: [facility];  Service: Orthopedics;  Laterality: Right;   History  Substance Use Topics  . Smoking status: Never Smoker   . Smokeless tobacco: Never Used  . Alcohol Use: No   Family History  Problem Relation Age of Onset  . Cancer Mother     breast ca with mets  . Dementia Mother     Alzheimers  . Cancer Father   . Diabetes Sister   . Hyperlipidemia Brother   . Heart disease Brother     quad bypass  . Colon cancer Neg Hx   . Stomach cancer Neg Hx    No Known Allergies Current Outpatient Prescriptions on File Prior to Visit  Medication Sig Dispense Refill  . aspirin (ADULT ASPIRIN EC LOW STRENGTH) 81 MG EC tablet Take 81 mg by mouth daily.       . Multiple Minerals-Vitamins (NUTRA-SUPPORT BONE PO) Take by mouth daily. LIQUID FORM      . VIAGRA 100 MG tablet TAKE ONCE DAILY AS DIRECTED  12 tablet  12   No current facility-administered medications on file prior to visit.   The PMH, PSH, Social History, Family History, Medications, and allergies have been reviewed in [facility], and have been updated if relevant.   Review of Systems    See HPI Objective:    BP 132/76  Pulse 76  Temp(Src) 98 F (36.7 C) (Oral)  Wt 184 lb 8 oz (83.689 kg)  SpO2 95%   Physical Exam  Gen:  Alert, pleasant, NAD MSK: No pain to palp over spine +spurlings      Assessment & Plan:   Cervical nerve root impingement - Plan: DG Cervical Spine Complete No Follow-up on file.

## 2013-12-21 NOTE — Patient Instructions (Signed)
Good to see. We will call you with your xray results.

## 2013-12-21 NOTE — Progress Notes (Signed)
Pre visit review using our clinic review tool, if applicable. No additional management support is needed unless otherwise documented below in the visit note. 

## 2014-03-05 ENCOUNTER — Other Ambulatory Visit: Payer: Self-pay | Admitting: Family Medicine

## 2014-03-05 DIAGNOSIS — G250 Essential tremor: Secondary | ICD-10-CM

## 2014-03-05 DIAGNOSIS — E785 Hyperlipidemia, unspecified: Secondary | ICD-10-CM

## 2014-03-05 DIAGNOSIS — R972 Elevated prostate specific antigen [PSA]: Secondary | ICD-10-CM

## 2014-03-15 ENCOUNTER — Other Ambulatory Visit (INDEPENDENT_AMBULATORY_CARE_PROVIDER_SITE_OTHER): Payer: BC Managed Care – PPO

## 2014-03-15 DIAGNOSIS — R972 Elevated prostate specific antigen [PSA]: Secondary | ICD-10-CM

## 2014-03-15 DIAGNOSIS — E785 Hyperlipidemia, unspecified: Secondary | ICD-10-CM

## 2014-03-15 LAB — COMPREHENSIVE METABOLIC PANEL
ALBUMIN: 4.1 g/dL (ref 3.5–5.2)
ALT: 30 U/L (ref 0–53)
AST: 24 U/L (ref 0–37)
Alkaline Phosphatase: 73 U/L (ref 39–117)
BUN: 17 mg/dL (ref 6–23)
CALCIUM: 9.3 mg/dL (ref 8.4–10.5)
CHLORIDE: 107 meq/L (ref 96–112)
CO2: 30 mEq/L (ref 19–32)
Creatinine, Ser: 0.9 mg/dL (ref 0.4–1.5)
GFR: 89.32 mL/min (ref >60.00)
Glucose, Bld: 90 mg/dL (ref 70–99)
POTASSIUM: 4.8 meq/L (ref 3.5–5.1)
Sodium: 142 mEq/L (ref 135–145)
Total Bilirubin: 0.9 mg/dL (ref 0.2–1.2)
Total Protein: 6.8 g/dL (ref 6.0–8.3)

## 2014-03-15 LAB — LIPID PANEL
CHOL/HDL RATIO: 4
Cholesterol: 179 mg/dL (ref 0–200)
HDL: 50.4 mg/dL (ref >39.00)
LDL Cholesterol: 111 mg/dL — ABNORMAL HIGH (ref 0–99)
NONHDL: 128.6
TRIGLYCERIDES: 87 mg/dL (ref 0.0–149.0)
VLDL: 17.4 mg/dL (ref 0.0–40.0)

## 2014-03-15 LAB — PSA: PSA: 5.13 ng/mL — AB (ref 0.10–4.00)

## 2014-03-21 ENCOUNTER — Telehealth: Payer: Self-pay

## 2014-03-21 ENCOUNTER — Ambulatory Visit (INDEPENDENT_AMBULATORY_CARE_PROVIDER_SITE_OTHER): Payer: BC Managed Care – PPO | Admitting: Family Medicine

## 2014-03-21 ENCOUNTER — Encounter: Payer: Self-pay | Admitting: Family Medicine

## 2014-03-21 VITALS — BP 128/78 | HR 65 | Temp 97.7°F | Ht 68.5 in | Wt 179.8 lb

## 2014-03-21 DIAGNOSIS — S46812D Strain of other muscles, fascia and tendons at shoulder and upper arm level, left arm, subsequent encounter: Secondary | ICD-10-CM

## 2014-03-21 DIAGNOSIS — Z5189 Encounter for other specified aftercare: Secondary | ICD-10-CM

## 2014-03-21 DIAGNOSIS — S46819A Strain of other muscles, fascia and tendons at shoulder and upper arm level, unspecified arm, initial encounter: Secondary | ICD-10-CM | POA: Insufficient documentation

## 2014-03-21 DIAGNOSIS — S43499A Other sprain of unspecified shoulder joint, initial encounter: Secondary | ICD-10-CM

## 2014-03-21 DIAGNOSIS — E785 Hyperlipidemia, unspecified: Secondary | ICD-10-CM

## 2014-03-21 DIAGNOSIS — R109 Unspecified abdominal pain: Secondary | ICD-10-CM

## 2014-03-21 DIAGNOSIS — Z0000 Encounter for general adult medical examination without abnormal findings: Secondary | ICD-10-CM

## 2014-03-21 DIAGNOSIS — R972 Elevated prostate specific antigen [PSA]: Secondary | ICD-10-CM

## 2014-03-21 DIAGNOSIS — R1031 Right lower quadrant pain: Secondary | ICD-10-CM

## 2014-03-21 MED ORDER — SILDENAFIL CITRATE 100 MG PO TABS: ORAL_TABLET | ORAL | Status: DC

## 2014-03-21 MED ORDER — SILDENAFIL CITRATE 20 MG PO TABS: 20.0000 mg | ORAL_TABLET | Freq: Three times a day (TID) | ORAL | Status: DC

## 2014-03-21 MED ORDER — CYCLOBENZAPRINE HCL 5 MG PO TABS: ORAL_TABLET | ORAL | Status: DC

## 2014-03-21 NOTE — Telephone Encounter (Signed)
Yes ok to change to 20 mg.

## 2014-03-21 NOTE — Assessment & Plan Note (Signed)
Concerning given location of previous repair. Will get CT of abdomen and pelvis for further evaluation. The patient indicates understanding of these issues and agrees with the plan.

## 2014-03-21 NOTE — Assessment & Plan Note (Signed)
Reviewed preventive care protocols, scheduled due services, and updated immunizations Discussed nutrition, exercise, diet, and healthy lifestyle.  He will call his insurance company about coverage for zostavax.

## 2014-03-21 NOTE — Progress Notes (Signed)
Pre visit review using our clinic review tool, if applicable. No additional management support is needed unless otherwise documented below in the visit note. 

## 2014-03-21 NOTE — Telephone Encounter (Signed)
Spoke to pharmacy and advised them of change; ok per Dr Deborra Medina

## 2014-03-21 NOTE — Patient Instructions (Signed)
Good to see you. Check with your insurance to see if they will cover the shingles shot.  Please stop by to see Rosaria Ferries on your way out to set up your CT scan.

## 2014-03-21 NOTE — Assessment & Plan Note (Signed)
Stable No changes 

## 2014-03-21 NOTE — Assessment & Plan Note (Signed)
Flexeril prn qhs.

## 2014-03-21 NOTE — Telephone Encounter (Signed)
Rob at [facility] left v/m; wanted to know if Viagra rx sent could be for Viagra 20 mg instead of Viagra 100 mg. Viagra 100 mg cost would be $2,000.00 (two thousand dollars). Rob request cb.

## 2014-03-21 NOTE — Assessment & Plan Note (Signed)
?   If biopsy done. Will call urology for results. PSA relatively stable.

## 2014-03-21 NOTE — Progress Notes (Signed)
63 yo pleasant male here for CPX.    Abdominal pain-  Right inguinal pain for past 4-5 months, intermittent.  4-5/10. Associated with nausea, no vomiting. In same area as his inguinal hernia repair in 2008. No changes in bowel habits or blood in his stool.  Colonoscopy 6/20/13Deatra Ina- 3 year recall. Td 01/22/09.  Still having some neck pain without radiation.  Unchanged.  Elevated PSA- denies any increased difficulty starting or stopping urinary stream.  Ongoing issue.  Referred him to urology last year.  Last note scanned in Epic from urology was from Dr. Jasmine December from 04/13/13- prostate biopsy discussed but I do not have those results.   Lab Results  Component Value Date   PSA 5.13* 03/15/2014   PSA 4.07* 09/14/2013   PSA 6.30* 03/13/2013   H/o persistent DOE- referred back to cardiology.  Was cleared.  HLD- Lab Results  Component Value Date   CHOL 179 03/15/2014   HDL 50.40 03/15/2014   LDLCALC 111* 03/15/2014   LDLDIRECT 155.5 09/14/2013   TRIG 87.0 03/15/2014   CHOLHDL 4 03/15/2014   Patient Active Problem List   Diagnosis Date Noted  . Routine general medical examination at a health care facility 03/21/2014  . 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 Elevated PSA, less than 10 ng/ml 03/19/2013  . Essential tremor 03/19/2013  . HYPERLIPIDEMIA 12/21/2007  . ERECTILE DYSFUNCTION 12/21/2007  . CATARACT, LEFT EYE 12/21/2007  . INGUINAL HERNIA, RIGHT 04/18/2007   Past Medical History  Diagnosis Date  . Hyperlipidemia   . Shortness of breath   . Right knee meniscal tear   . Arthritis     KNEES   Past Surgical History  Procedure Laterality Date  . Cardiac catheterization  06-17-2006  DR COOPER    NORMAL CORONARY ARTERIES/ NORMAL LVF  . Cardiovascular stress test  02-16-2012    NORMAL LEXISCAN NUCLEAR STUDY/ EF 61%  . Inguinal hernia repair Right 05-24-2007  . Lumbar fusion  1980'S    L4 -- S1  . Cataract extraction w/ intraocular lens  implant, bilateral    . Knee  arthroscopy with medial menisectomy Right 02/16/2013    Procedure: RIGHT KNEE ARTHROSCOPY WITH PARTIAL MEDIAL AND LATERAL  MENISECTOMY;  Surgeon: Johnn Hai, MD;  Location: [facility];  Service: Orthopedics;  Laterality: Right;   History  Substance Use Topics  . Smoking status: Never Smoker   . Smokeless tobacco: Never Used  . Alcohol Use: No   Family History  Problem Relation Age of Onset  . Cancer Mother     breast ca with mets  . Dementia Mother     Alzheimers  . Cancer Father   . Diabetes Sister   . Hyperlipidemia Brother   . Heart disease Brother     quad bypass  . Colon cancer Neg Hx   . Stomach cancer Neg Hx    No Known Allergies Current Outpatient Prescriptions on File Prior to Visit  Medication Sig Dispense Refill  . aspirin (ADULT ASPIRIN EC LOW STRENGTH) 81 MG EC tablet Take 81 mg by mouth daily.       . Multiple Minerals-Vitamins (NUTRA-SUPPORT BONE PO) Take by mouth daily. LIQUID FORM      . sildenafil (VIAGRA) 100 MG tablet TAKE ONCE DAILY AS DIRECTED  12 tablet  12   No current facility-administered medications on file prior to visit.     The PMH, PSH, Social History, Family History, Medications, and allergies have been reviewed in [facility],  and have been updated if relevant.   Review of Systems       See HPI + nausea, sometimes post pradial No vomiting No changes in bowel habits- no blood in stool No difficulty with urination or increase in urinary frequency Denies any anxiety or depression No SOB or CP- works 10 hours days Denies any blurred vision +tremor- unchanged  Physical Exam BP 128/78  Pulse 65  Temp(Src) 97.7 F (36.5 C) (Oral)  Ht 5' 8.5" (1.74 m)  Wt 179 lb 12 oz (81.534 kg)  BMI 26.93 kg/m2  SpO2 97% General:  pleasant male in NAD Eyes:  PERRL Ears:  External ear exam shows no significant lesions or deformities.  Otoscopic examination reveals clear canals, tympanic membranes are intact bilaterally without bulging,  retraction, inflammation or discharge. Hearing is grossly normal bilaterally. Nose:  External nasal examination shows no deformity or inflammation. Nasal mucosa are pink and moist without lesions or exudates. Mouth:  Oral mucosa and oropharynx without lesions or exudates.  Teeth in good repair. Neck:  no carotid bruit or thyromegaly no cervical or supraclavicular lymphadenopathy  Left cervical and trapezius tightness, TTP Lungs:  Normal respiratory effort, chest expands symmetrically. Lungs are clear to auscultation, no crackles or wheezes. Heart:  Normal rate and regular rhythm. S1 and S2 normal without gallop, murmur, click, rub or other extra sounds. Abdomen:  Bowel sounds positive,without masses, organomegaly or hernias noted. TTP with some firmness with Valsalva in right inguinal area, no reducible bulge Pulses:  R and L posterior tibial pulses are full and equal bilaterally  Extremities:  no edema  Prostate/genital exam- deferred, has urologist

## 2014-04-24 ENCOUNTER — Inpatient Hospital Stay: Admission: RE | Admit: 2014-04-24 | Payer: BC Managed Care – PPO | Source: Ambulatory Visit

## 2014-12-13 ENCOUNTER — Encounter: Payer: Self-pay | Admitting: Gastroenterology

## 2014-12-31 ENCOUNTER — Telehealth: Payer: Self-pay | Admitting: *Deleted

## 2014-12-31 ENCOUNTER — Encounter: Payer: Self-pay | Admitting: Family Medicine

## 2014-12-31 ENCOUNTER — Ambulatory Visit (INDEPENDENT_AMBULATORY_CARE_PROVIDER_SITE_OTHER): Payer: Managed Care, Other (non HMO) | Admitting: Family Medicine

## 2014-12-31 VITALS — BP 140/82 | HR 61 | Temp 97.9°F | Wt 174.2 lb

## 2014-12-31 DIAGNOSIS — S79911A Unspecified injury of right hip, initial encounter: Secondary | ICD-10-CM

## 2014-12-31 MED ORDER — SILDENAFIL CITRATE 20 MG PO TABS: 20.0000 mg | ORAL_TABLET | Freq: Three times a day (TID) | ORAL | Status: DC

## 2014-12-31 MED ORDER — CYCLOBENZAPRINE HCL 5 MG PO TABS: ORAL_TABLET | ORAL | Status: DC

## 2014-12-31 MED ORDER — PREDNISONE 20 MG PO TABS: ORAL_TABLET | ORAL | Status: DC

## 2014-12-31 NOTE — Telephone Encounter (Signed)
Pharmacist at [facility] called regarding Rx for Sildenafil 20mg .  Written for three times daily, #50.  She is calling to verify instructions.  Please clarify if you want the patient to take TID or PRN.

## 2014-12-31 NOTE — Assessment & Plan Note (Signed)
New- occurred over one month ago and still quite tender with limited ROM on exam. I do want to get a hip xray- he does not have time for this today--ordered and he will return for this. Course of prednisone eRx sent. After initial imaging, may need further imaging and or ortho referral. The patient indicates understanding of these issues and agrees with the plan.

## 2014-12-31 NOTE — Progress Notes (Signed)
Pre visit review using our clinic review tool, if applicable. No additional management support is needed unless otherwise documented below in the visit note. 

## 2014-12-31 NOTE — Telephone Encounter (Signed)
three times daily prn.  Isn't this the proper quantity they will cover at [facility] for generic viagra?

## 2014-12-31 NOTE — Telephone Encounter (Signed)
Spoke to pt and verified Rx instruction

## 2014-12-31 NOTE — Progress Notes (Signed)
Subjective:   Patient ID: [name] [name], male    DOB: 01-Feb-1951, 64 y.o.   MRN: [id number]  [name] [name] is a pleasant 64 y.o. year old male who presents to clinic today with Hip Pain  on 12/31/2014  HPI: Right hip injury- Was working on a car at work and pulled a piece of machinery that gave out and he fell to his knees.  Since then, intermittent, severe, right hip pain.  Pain is much worse when he is sitting, especially for long periods of time.  Actually tender to touch in certain places.  No radiation of pain down his leg.  No LE weakness of urinary symptoms. Has not taken anything for it.  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  Current Outpatient Prescriptions on File Prior to Visit  Medication Sig Dispense Refill  . aspirin (ADULT ASPIRIN EC LOW STRENGTH) 81 MG EC tablet Take 81 mg by mouth daily.     . Multiple Minerals-Vitamins (NUTRA-SUPPORT BONE PO) Take by mouth daily. LIQUID FORM     No current facility-administered medications on file prior to visit.    No Known Allergies  Past Medical History  Diagnosis Date  . Hyperlipidemia   . Shortness of breath   . Right knee meniscal tear   . Arthritis     KNEES    Past Surgical History  Procedure Laterality Date  . Cardiac catheterization  06-17-2006  DR COOPER    NORMAL CORONARY ARTERIES/ NORMAL LVF  . Cardiovascular stress test  02-16-2012    NORMAL LEXISCAN NUCLEAR STUDY/ EF 61%  . Inguinal hernia repair Right 05-24-2007  . Lumbar fusion  1980'S    L4 -- S1  . Cataract extraction w/ intraocular lens  implant, bilateral    . Knee arthroscopy with medial menisectomy Right 02/16/2013    Procedure: RIGHT KNEE ARTHROSCOPY WITH PARTIAL MEDIAL AND LATERAL  MENISECTOMY;  Surgeon: Johnn Hai, MD;  Location: [facility];  Service: Orthopedics;  Laterality: Right;    Family History  Problem Relation Age of Onset   . Cancer Mother     breast ca with mets  . Dementia Mother     Alzheimers  . Cancer Father   . Diabetes Sister   . Hyperlipidemia Brother   . Heart disease Brother     quad bypass  . Colon cancer Neg Hx   . Stomach cancer Neg Hx     History   Social History  . Marital Status: Married    Spouse Name: N/A  . Number of Children: 3  . Years of Education: N/A   Occupational History  . [employer]    Social History Main Topics  . Smoking status: Never Smoker   . Smokeless tobacco: Never Used  . Alcohol Use: No  . Drug Use: No  . Sexual Activity: Not on file   Other Topics Concern  . Not on file   Social History Narrative   The PMH, PSH, Social History, Family History, Medications, and allergies have been reviewed in [facility], and have been updated if relevant.   Review of Systems  Constitutional: Negative.   Cardiovascular: Negative.   Gastrointestinal: Negative.   Genitourinary: Negative.   Musculoskeletal: Negative for joint swelling, arthralgias, gait problem, neck pain and neck stiffness.  Neurological: Negative.   Psychiatric/Behavioral: Negative.  Objective:    BP 140/82 mmHg  Pulse 61  Temp(Src) 97.9 F (36.6 C) (Oral)  Wt 174 lb 4 oz (79.039 kg)  SpO2 97%   Physical Exam  Constitutional: He is oriented to person, place, and time. He appears well-developed and well-nourished. No distress.  HENT:  Head: Normocephalic.  Eyes: Conjunctivae are normal.  Cardiovascular: Normal rate.   Pulmonary/Chest: Effort normal.  Musculoskeletal: He exhibits tenderness. He exhibits no edema.       Right hip: He exhibits decreased range of motion, decreased strength and tenderness. He exhibits no swelling, no crepitus, no deformity and no laceration.  +fabers right Neg SLR bilaterally  Neurological: He is alert and oriented to person, place, and time. No cranial nerve deficit.  Skin: Skin is warm and dry.  Psychiatric: He has a normal mood and  affect. His behavior is normal. Judgment and thought content normal.  Nursing note and vitals reviewed.         Assessment & Plan:   Injury of hip, right, initial encounter - Plan: DG Arthro Hip Right No Follow-up on file.

## 2014-12-31 NOTE — Patient Instructions (Signed)
Good to see you. Please take prednisone as directed and with food. Ok to take Tylenol with this as well- please do not take motrin, alleve or ibuprofen (NSAIDS) while taking this.  Please come back for an xray at your convenience.

## 2015-01-08 NOTE — Telephone Encounter (Signed)
Pt picked up sildenafil 20 mg on 01/01/15. Pt said he takes 4 tabs of the sildenafil 20 mg and med is not effective. Pt request cb to advise what to do.[place].

## 2015-01-08 NOTE — Telephone Encounter (Signed)
That is a high dose and I would suggest I refer him to urology for further work up and treatment.

## 2015-01-09 NOTE — Telephone Encounter (Signed)
Spoke to pt and advised; pt verbally expressed understanding. Pt states he is not wanting referral at this time.

## 2015-01-14 ENCOUNTER — Telehealth: Payer: Self-pay | Admitting: Family Medicine

## 2015-01-14 DIAGNOSIS — R109 Unspecified abdominal pain: Secondary | ICD-10-CM | POA: Insufficient documentation

## 2015-01-14 NOTE — Telephone Encounter (Signed)
Pt called wanting a referral to GI dr   He is having stomach pain He stated he has already talked to dr Deborra Medina about this

## 2015-01-14 NOTE — Telephone Encounter (Signed)
Referral placed.

## 2015-01-16 ENCOUNTER — Telehealth: Payer: Self-pay | Admitting: Gastroenterology

## 2015-01-17 NOTE — Telephone Encounter (Signed)
Call to patient. Offered an appointment on 01/23/15. Patient declines. He will be on vacation. Accepts an appointment 01/27/15 at 3 pm with Nicoletta Ba, PA. He states the pain is really bad in the morning when he moves his legs to get out of the bed. He had hernia repair in the past and feels this is related to his pain. He denies any bowel movement changes, nausea, vomiting or weight loss. The pain he feels is in the lower abdominal area and wraps into his back. He is on prednisone for back/hip pain. Had abdominal CT scheduled last year but was a no show for that appointment. Records printed and put into PA folder.

## 2015-01-23 ENCOUNTER — Ambulatory Visit: Payer: Managed Care, Other (non HMO) | Admitting: Nurse Practitioner

## 2015-01-24 ENCOUNTER — Encounter: Payer: Self-pay | Admitting: *Deleted

## 2015-01-27 ENCOUNTER — Other Ambulatory Visit (INDEPENDENT_AMBULATORY_CARE_PROVIDER_SITE_OTHER): Payer: Managed Care, Other (non HMO)

## 2015-01-27 ENCOUNTER — Ambulatory Visit (INDEPENDENT_AMBULATORY_CARE_PROVIDER_SITE_OTHER): Payer: Managed Care, Other (non HMO) | Admitting: Physician Assistant

## 2015-01-27 ENCOUNTER — Encounter: Payer: Self-pay | Admitting: Physician Assistant

## 2015-01-27 VITALS — BP 134/76 | HR 64 | Ht 70.0 in | Wt 171.3 lb

## 2015-01-27 DIAGNOSIS — R1031 Right lower quadrant pain: Secondary | ICD-10-CM

## 2015-01-27 DIAGNOSIS — Z8719 Personal history of other diseases of the digestive system: Secondary | ICD-10-CM

## 2015-01-27 DIAGNOSIS — R1314 Dysphagia, pharyngoesophageal phase: Secondary | ICD-10-CM

## 2015-01-27 DIAGNOSIS — Z86010 Personal history of colonic polyps: Secondary | ICD-10-CM | POA: Diagnosis not present

## 2015-01-27 LAB — COMPREHENSIVE METABOLIC PANEL
ALT: 23 U/L (ref 0–53)
AST: 21 U/L (ref 0–37)
Albumin: 4.1 g/dL (ref 3.5–5.2)
Alkaline Phosphatase: 74 U/L (ref 39–117)
BUN: 15 mg/dL (ref 6–23)
CALCIUM: 9.1 mg/dL (ref 8.4–10.5)
CHLORIDE: 104 meq/L (ref 96–112)
CO2: 32 mEq/L (ref 19–32)
Creatinine, Ser: 0.79 mg/dL (ref 0.40–1.50)
GFR: 104.87 mL/min (ref >60.00)
Glucose, Bld: 75 mg/dL (ref 70–99)
Potassium: 3.9 mEq/L (ref 3.5–5.1)
SODIUM: 140 meq/L (ref 135–145)
Total Bilirubin: 0.5 mg/dL (ref 0.2–1.2)
Total Protein: 6.7 g/dL (ref 6.0–8.3)

## 2015-01-27 LAB — CBC WITH DIFFERENTIAL/PLATELET
BASOS ABS: 0 10*3/uL (ref 0.0–0.1)
Basophils Relative: 0.4 % (ref 0.0–3.0)
EOS ABS: 0.1 10*3/uL (ref 0.0–0.7)
Eosinophils Relative: 1.8 % (ref 0.0–5.0)
HCT: 46.3 % (ref 39.0–52.0)
Hemoglobin: 16 g/dL (ref 13.0–17.0)
LYMPHS PCT: 25.6 % (ref 12.0–46.0)
Lymphs Abs: 1.8 10*3/uL (ref 0.7–4.0)
MCHC: 34.5 g/dL (ref 30.0–36.0)
MCV: 94.4 fl (ref 78.0–100.0)
Monocytes Absolute: 0.5 10*3/uL (ref 0.1–1.0)
Monocytes Relative: 7.6 % (ref 3.0–12.0)
Neutro Abs: 4.6 10*3/uL (ref 1.4–7.7)
Neutrophils Relative %: 64.6 % (ref 43.0–77.0)
PLATELETS: 230 10*3/uL (ref 150.0–400.0)
RBC: 4.9 Mil/uL (ref 4.22–5.81)
RDW: 13.4 % (ref 11.5–15.5)
WBC: 7.1 10*3/uL (ref 4.0–10.5)

## 2015-01-27 MED ORDER — NA SULFATE-K SULFATE-MG SULF 17.5-3.13-1.6 GM/177ML PO SOLN: 1.0000 | Freq: Once | ORAL | Status: DC

## 2015-01-27 NOTE — Patient Instructions (Addendum)
  You have been scheduled for a CT scan of the abdomen and pelvis at [facility] ([address]---this is in the same building as Press [facility]).   You are scheduled on  Friday 01-31-2015 at  3:30 am . You should arrive at 3:15 PM  prior to your appointment time for registration. Please follow the written instructions below on the day of your exam:  WARNING: IF YOU ARE ALLERGIC TO IODINE/X-RAY DYE, PLEASE NOTIFY RADIOLOGY IMMEDIATELY AT [phone]! YOU WILL BE GIVEN A 13 HOUR PREMEDICATION PREP.  1) Do not eat or drink anything after 11:30 am  (4 hours prior to your test) 2) You have been given 2 bottles of oral contrast to drink. The solution may taste  better if refrigerated, but do NOT add ice or any other liquid to this solution. Shake  well before drinking.    Drink 1 bottle of contrast @ 1:30 PM  (2 hours prior to your exam)  Drink 1 bottle of contrast @ 2:30 PM  (1 hour prior to your exam)  You may take any medications as prescribed with a small amount of water except for the following: Metformin, Glucophage, Glucovance, Avandamet, Riomet, Fortamet, Actoplus Met, Janumet, Glumetza or Metaglip. The above medications must be held the day of the exam AND 48 hours after the exam.  The purpose of you drinking the oral contrast is to aid in the visualization of your intestinal tract. The contrast solution may cause some diarrhea. Before your exam is started, you will be given a small amount of fluid to drink. Depending on your individual set of symptoms, you may also receive an intravenous injection of x-ray contrast/dye. Plan on being at [facility] for 30 minutes or long, depending on the type of exam you are having performed.  If you have any questions regarding your exam or if you need to reschedule, you may call the CT department at [phone] between the hours of 8:00 am and 5:00 pm,  Monday-Friday.  ________________________________________________________________________

## 2015-01-27 NOTE — Progress Notes (Signed)
Patient ID: [name] [name], male   DOB: Jul 22, 1951, 64 y.o.   MRN: [id number]   Subjective:    Patient ID: [name] [name], male    DOB: 06/19/1951, 64 y.o.   MRN: [id number]  HPI [name] [name]  Is a 64 year old white male known to Dr. Deatra [name] from prior colonoscopy. He is referred today by Dr. Arnette [name] for evaluation of right lower quadrant abdominal pain.  patient had colonoscopy in June 2013 was found to have multiple colon polyps the largest was 8 mm. 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 He is due for follow-up in June of this year. Patient relates prior Nissen fundoplication and had a right inguinal hernia repair several years ago. He says he has had some chronic right lower quadrant pain ever since that time which is gotten progressively worse over the past year. Says he has pain in the right lower part of his abdomen that radiates across his abdomen frequently this seems to be worse with lifting etc. He denies any problems with his bowels no melena or hematochezia. His appetite is been fine his weight has been stable  He also reports solid food dysphagia for the past couple of years. He says he just by habit cuts his food up in very small pieces because he has difficulty with his food hanging if he eats too big of bites. Is no chronic heartburn or indigestion.  Review of Systems Pertinent positive and negative review of systems were noted in the above HPI section.  All other review of systems was otherwise negative.  Outpatient Encounter Prescriptions as of 01/27/2015  Medication Sig  . aspirin (ADULT ASPIRIN EC LOW STRENGTH) 81 MG EC tablet Take 81 mg by mouth daily.   . cyclobenzaprine (FLEXERIL) 5 MG tablet 1-2 tab po at night as needed for muscle spasms  . Multiple Minerals-Vitamins (NUTRA-SUPPORT BONE PO) Take by mouth daily. LIQUID FORM  . sildenafil (REVATIO) 20 MG tablet Take 1 tablet (20 mg total) by mouth 3 (three) times daily. (Patient taking differently:  Take 20 mg by mouth daily. Take 1 tab daily.)  . Na Sulfate-K Sulfate-Mg Sulf SOLN Take 1 kit by mouth once.  . [DISCONTINUED] predniSONE (DELTASONE) 20 MG tablet 3 tabs by mouth daily x 3 days, 2 tabs by mouth daily x 2 days, 1 tab by mouth x 2 days, 1/2 tab by mouth x 2 days and stop (Patient not taking: Reported on 01/27/2015)   No facility-administered encounter medications on file as of 01/27/2015.   No Known Allergies Patient Active Problem List   Diagnosis Date Noted  . Abdominal pain 01/14/2015  . Injury of hip, right 12/31/2014  . Routine general medical examination at a health care facility 03/21/2014  . Right inguinal pain 03/21/2014  . Trapezius muscle strain 03/21/2014  . 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 Elevated PSA, less than 10 ng/ml 03/19/2013  . Essential tremor 03/19/2013  . HYPERLIPIDEMIA 12/21/2007  . ERECTILE DYSFUNCTION 12/21/2007  . CATARACT, LEFT EYE 12/21/2007  . INGUINAL HERNIA, RIGHT 04/18/2007   History   Social History  . Marital Status: Married    Spouse Name: N/A  . Number of Children: 3  . Years of Education: N/A   Occupational History  . [employer]    Social History Main Topics  . Smoking status: Never Smoker   . Smokeless tobacco: Never Used  . Alcohol Use: No  . Drug Use: No  . Sexual  Activity: Not on file   Other Topics Concern  . Not on file   Social History Narrative    [name] family history includes Cancer in his father and mother; Dementia in his mother; Diabetes in his sister; Heart disease in his brother; Hyperlipidemia in his brother. There is no history of Colon cancer or Stomach cancer.      Objective:    Filed Vitals:   01/27/15 1617  BP: 134/76  Pulse:     Physical Exam   Well-developed older white male in no acute distress, blood pressure 134/76 pulse 72 , weight 174. HEENT ;nontraumatic normocephalic EOMI PERRLA sclera anicteric, Supple; no JVD, Cardiovascular ;regular rate and rhythm  with S1-S2 no murmur or gallop, Pulmonary; clear bilaterally, Abdomen ;soft he is tender in the right lower quadrant no palpable hernia or mass bowel sounds are present, Rectal ;exam not done, Extremities ;no clubbing cyanosis or edema skin warm and dry, Psych; mood and affect appropriate       Assessment & Plan:   #1 64 yo male  With several month history of right lower quadrant pain the patient is status post remote right inguinal hernia repair. 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 Deatra [name].   [name] [name] [name] Harold PA-C 01/27/2015   Cc: [name] Passy, MD

## 2015-01-28 NOTE — Progress Notes (Signed)
Reviewed and agree with management. Robert D. Kaplan, M.D., FACG  

## 2015-01-31 ENCOUNTER — Ambulatory Visit (INDEPENDENT_AMBULATORY_CARE_PROVIDER_SITE_OTHER)
Admission: RE | Admit: 2015-01-31 | Discharge: 2015-01-31 | Disposition: A | Discharge status: Home / Self Care | Payer: Managed Care, Other (non HMO) | Source: Ambulatory Visit | Attending: Physician Assistant | Admitting: Physician Assistant

## 2015-01-31 DIAGNOSIS — R1031 Right lower quadrant pain: Secondary | ICD-10-CM | POA: Diagnosis not present

## 2015-01-31 DIAGNOSIS — Z8719 Personal history of other diseases of the digestive system: Secondary | ICD-10-CM | POA: Diagnosis not present

## 2015-01-31 MED ORDER — IOHEXOL 300 MG/ML  SOLN
100.0000 mL | Freq: Once | INTRAMUSCULAR | Status: AC | PRN
  Administered 2015-01-31: 100 mL via INTRAVENOUS

## 2015-02-18 ENCOUNTER — Telehealth: Payer: Self-pay

## 2015-02-18 NOTE — Telephone Encounter (Signed)
Opened in error

## 2015-02-20 ENCOUNTER — Telehealth: Payer: Self-pay

## 2015-02-20 NOTE — Telephone Encounter (Signed)
[name] [name], I am trying to schedule this guy but he has very strict restrictions. He will only schedule in the mornings because he intends to go to work that evening at 5 pm. I had scheduled him for separate procedures to accommodate his scheduling needs. Then he missed his pre-visit. Then they pre-visit nurse suggested double procedure in order to instruct it easier.  You want it done before the end of June. I do not have anything like that. I am not sure what I want you to do about it, but it's an issue for certain. Which should be done first?  EGD or colon?  64 yo male With several month history of right lower quadrant pain the patient is status post remote right inguinal hernia repair. Etiology of his pain is unclear rule out small recurrent hernia , adhesions or other occult lesion  IMPRESSION: Suggestion of wall thickening at the GE junction. Additionally there is low attenuation within the central aspect of the stomach which may be secondary to mixture of oral contrast and intraluminal debris. Recommend correlation with direct visualization to exclude the possibility of gastric mass.

## 2015-02-21 NOTE — Telephone Encounter (Signed)
How about we see if somebody else has opening for a double in the next several weeks... If pt ok with another md doing procedures, check gessner, pyrtle [place]

## 2015-02-21 NOTE — Telephone Encounter (Signed)
Patient contacted about a date on 03/05/15. He will call back with his schedule in hand to discuss this with me.

## 2015-02-24 NOTE — Telephone Encounter (Signed)
Spoke with [name] [name] this morning. He states he will keep the 03/05/15 appointment for the egd/colon. He agrees to a pre-visit tomorrow at 4:30 pm.

## 2015-02-25 ENCOUNTER — Ambulatory Visit (AMBULATORY_SURGERY_CENTER): Payer: Self-pay

## 2015-02-25 VITALS — Ht 70.5 in | Wt 170.0 lb

## 2015-02-25 DIAGNOSIS — R1031 Right lower quadrant pain: Secondary | ICD-10-CM

## 2015-02-25 NOTE — Progress Notes (Signed)
No allergies to eggs or soy No diet/weight loss meds No home oxygen No past problems with anesthesia  No email 

## 2015-02-26 ENCOUNTER — Other Ambulatory Visit: Payer: Self-pay | Admitting: *Deleted

## 2015-02-26 DIAGNOSIS — R109 Unspecified abdominal pain: Secondary | ICD-10-CM

## 2015-02-27 ENCOUNTER — Encounter: Payer: Managed Care, Other (non HMO) | Admitting: Gastroenterology

## 2015-03-05 ENCOUNTER — Encounter (HOSPITAL_COMMUNITY): Payer: Self-pay

## 2015-03-05 ENCOUNTER — Encounter (HOSPITAL_COMMUNITY)
Admission: RE | Disposition: A | Discharge status: Home / Self Care | Payer: Self-pay | Source: Ambulatory Visit | Attending: Gastroenterology

## 2015-03-05 ENCOUNTER — Ambulatory Visit (HOSPITAL_COMMUNITY)
Admission: RE | Admit: 2015-03-05 | Discharge: 2015-03-05 | Disposition: A | Discharge status: Home / Self Care | Payer: Managed Care, Other (non HMO) | Source: Ambulatory Visit | Attending: Gastroenterology | Admitting: Gastroenterology

## 2015-03-05 DIAGNOSIS — D125 Benign neoplasm of sigmoid colon: Secondary | ICD-10-CM | POA: Diagnosis not present

## 2015-03-05 DIAGNOSIS — R1031 Right lower quadrant pain: Secondary | ICD-10-CM | POA: Diagnosis present

## 2015-03-05 DIAGNOSIS — Z79899 Other long term (current) drug therapy: Secondary | ICD-10-CM | POA: Diagnosis not present

## 2015-03-05 DIAGNOSIS — R109 Unspecified abdominal pain: Secondary | ICD-10-CM

## 2015-03-05 DIAGNOSIS — Z9889 Other specified postprocedural states: Secondary | ICD-10-CM | POA: Diagnosis not present

## 2015-03-05 DIAGNOSIS — Z7982 Long term (current) use of aspirin: Secondary | ICD-10-CM | POA: Diagnosis not present

## 2015-03-05 DIAGNOSIS — G8929 Other chronic pain: Secondary | ICD-10-CM | POA: Insufficient documentation

## 2015-03-05 DIAGNOSIS — N529 Male erectile dysfunction, unspecified: Secondary | ICD-10-CM | POA: Diagnosis not present

## 2015-03-05 DIAGNOSIS — Z86010 Personal history of colonic polyps: Secondary | ICD-10-CM | POA: Insufficient documentation

## 2015-03-05 HISTORY — PX: COLONOSCOPY: SHX5424

## 2015-03-05 SURGERY — COLONOSCOPY
Anesthesia: Moderate Sedation

## 2015-03-05 MED ORDER — SODIUM CHLORIDE 0.9 % IV SOLN
INTRAVENOUS | Status: DC
  Administered 2015-03-05: 500 mL via INTRAVENOUS

## 2015-03-05 MED ORDER — DIPHENHYDRAMINE HCL 50 MG/ML IJ SOLN
INTRAMUSCULAR | Status: AC
  Filled 2015-03-05: qty 1

## 2015-03-05 MED ORDER — MIDAZOLAM HCL 10 MG/2ML IJ SOLN
INTRAMUSCULAR | Status: DC | PRN
Start: 2015-03-05 — End: 2015-03-05
  Administered 2015-03-05 (×3): 2 mg via INTRAVENOUS

## 2015-03-05 MED ORDER — FENTANYL CITRATE (PF) 100 MCG/2ML IJ SOLN
INTRAMUSCULAR | Status: DC | PRN
  Administered 2015-03-05 (×4): 25 ug via INTRAVENOUS

## 2015-03-05 MED ORDER — MIDAZOLAM HCL 5 MG/ML IJ SOLN
INTRAMUSCULAR | Status: AC
  Filled 2015-03-05: qty 2

## 2015-03-05 MED ORDER — FENTANYL CITRATE (PF) 100 MCG/2ML IJ SOLN
INTRAMUSCULAR | Status: AC
  Filled 2015-03-05: qty 2

## 2015-03-05 MED ORDER — DIPHENHYDRAMINE HCL 50 MG/ML IJ SOLN
INTRAMUSCULAR | Status: DC | PRN
  Administered 2015-03-05: 25 mg via INTRAVENOUS

## 2015-03-05 NOTE — Progress Notes (Signed)
Waiting for Dr. Deatra Ina to come and give them results

## 2015-03-05 NOTE — Progress Notes (Signed)
Pt presents today for procedure (colonoscopy) after receiving news his brother unexpectedly died at 3 am this morning.  He also received news his grade school aged niece was Dx yesterday with bone cancer after misdiagnosis of rheumatoid arthritis.  Pt presents with grief demonstrated with angry outburst concerning billing, and $$ of procedure.  Pt given emotional support by staff Wilburn Cornelia, RN and procedure nurse, MD and recovery Rn, myself.   Pt exhusted, only 2 hours sleep last pm, and taking somewhat longer to awaken after sedation.

## 2015-03-05 NOTE — Op Note (Signed)
[facility] [place], [address]   COLONOSCOPY PROCEDURE REPORT     EXAM DATE: 03/05/2015  PATIENT NAME:      [name] [name], [name] [name]           MR #:      [id number]  BIRTHDATE:       [birth year]      VISIT #:     [id number]  ATTENDING:     Inda Castle, MD     STATUS:     outpatient ASSISTANT:      Violeta Gelinas and Cristopher Estimable  INDICATIONS:  The patient is a 64 yr old male here for a colonoscopy due to history of colon polyps, right lower quadrant pain. PROCEDURE PERFORMED:     Colonoscopy with snare polypectomy MEDICATIONS:     Versed 8 mg IV, Fentanyl 100 mcg IV, and Benadryl 25 mg IV ESTIMATED BLOOD LOSS:     None  CONSENT: The patient understands the risks and benefits of the procedure and understands that these risks include, but are not limited to: sedation, allergic reaction, infection, perforation and/or bleeding. Alternative means of evaluation and treatment include, among others: physical exam, x-rays, and/or surgical intervention. The patient elects to proceed with this endoscopic procedure.  DESCRIPTION OF PROCEDURE: During intra-op preparation period all mechanical & medical equipment was checked for proper function. Hand hygiene and appropriate measures for infection prevention was taken. After the risks, benefits and alternatives of the procedure were thoroughly explained, Informed consent was verified, confirmed and timeout was successfully executed by the treatment team. A digital exam revealed no abnormalities of the rectum. The Pentax Ped Colon [id number] endoscope was introduced through the anus and advanced to the cecum, which was identified by both the appendix and ileocecal valve. (Suprep was used) excellent. The instrument was then slowly withdrawn as the colon was fully examined.Estimated blood loss is zero unless otherwise noted in this procedure report.   COLON FINDINGS: A sessile polyp measuring 8 mm in size was  found in the sigmoid colon.  A polypectomy was performed using snare cautery.  The resection was complete, the polyp tissue was completely retrieved and sent to histology.   The examination was otherwise normal. Retroflexed views revealed no abnormalities. The scope was then completely withdrawn from the patient and the procedure terminated. SCOPE WITHDRAWAL TIME:    ADVERSE EVENTS:      There were no immediate complications.  IMPRESSIONS:     1.  Sessile polyp was found in the sigmoid colon; polypectomy was performed using snare cautery 2.  The examination was otherwise normal  RECOMMENDATIONS:     If the polyp(s) removed today are proven to be adenomatous (pre-cancerous) polyps, you will need a repeat colonoscopy in 5 years.  Otherwise you should continue to follow colorectal cancer screening guidelines for "routine risk" patients with colonoscopy in 10 years.  You will receive a letter within 1-2 weeks with the results of your biopsy as well as final recommendations.  Please call my office if you have not received a letter after 3 weeks. RECALL:  _____________________________ Inda Castle, MD eSigned:  Inda Castle, MD 03/05/2015 8:50 AM   cc:  Arnette Norris, MD   CPT CODES: ICD CODES:  The ICD and CPT codes recommended by this software are interpretations from the data that the clinical staff has captured with the software.  The verification of the translation of this report to the ICD and CPT codes and modifiers  is the sole responsibility of the health care institution and practicing physician where this report was generated.  [employer]. will not be held responsible for the validity of the ICD and CPT codes included on this report.  AMA assumes no liability for data contained or not contained herein. CPT is a Designer, television/film set of the [employer].   PATIENT NAME:  [name] [name], [name] [name] MR#: [id number]

## 2015-03-05 NOTE — H&P (Signed)
HPI [name] [name] Is a 64 year old white male known to Dr. Deatra Ina from prior colonoscopy. He is referred today by Dr. Arnette Norris for evaluation of right lower quadrant abdominal pain. patient had colonoscopy in June 2013 was found to have multiple colon polyps the largest was 8 mm. 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 He is due for follow-up in June of this year. Patient relates prior Nissen fundoplication and had a right inguinal hernia repair several years ago. He says he has had some chronic right lower quadrant pain ever since that time which is gotten progressively worse over the past year. Says he has pain in the right lower part of his abdomen that radiates across his abdomen frequently this seems to be worse with lifting etc. He denies any problems with his bowels no melena or hematochezia. His appetite is been fine his weight has been stable  He also reports solid food dysphagia for the past couple of years. He says he just by habit cuts his food up in very small pieces because he has difficulty with his food hanging if he eats too big of bites. Is no chronic heartburn or indigestion.  CT scan did not demonstrate any abnormalities of the right lower quadrant  Review of Systems Pertinent positive and negative review of systems were noted in the above HPI section. All other review of systems was otherwise negative.  Outpatient Encounter Prescriptions as of 01/27/2015  Medication Sig  . aspirin (ADULT ASPIRIN EC LOW STRENGTH) 81 MG EC tablet Take 81 mg by mouth daily.   . cyclobenzaprine (FLEXERIL) 5 MG tablet 1-2 tab po at night as needed for muscle spasms  . Multiple Minerals-Vitamins (NUTRA-SUPPORT BONE PO) Take by mouth daily. LIQUID FORM  . sildenafil (REVATIO) 20 MG tablet Take 1 tablet (20 mg total) by mouth 3 (three) times daily. (Patient taking differently: Take 20 mg by mouth daily. Take 1 tab daily.)  . Na Sulfate-K Sulfate-Mg Sulf  SOLN Take 1 kit by mouth once.  . [DISCONTINUED] predniSONE (DELTASONE) 20 MG tablet 3 tabs by mouth daily x 3 days, 2 tabs by mouth daily x 2 days, 1 tab by mouth x 2 days, 1/2 tab by mouth x 2 days and stop (Patient not taking: Reported on 01/27/2015)   No facility-administered encounter medications on file as of 01/27/2015.   No Known Allergies Patient Active Problem List   Diagnosis Date Noted  . Abdominal pain 01/14/2015  . Injury of hip, right 12/31/2014  . Routine general medical examination at a health care facility 03/21/2014  . Right inguinal pain 03/21/2014  . Trapezius muscle strain 03/21/2014  . 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 Elevated PSA, less than 10 ng/ml 03/19/2013  . Essential tremor 03/19/2013  . HYPERLIPIDEMIA 12/21/2007  . ERECTILE DYSFUNCTION 12/21/2007  . CATARACT, LEFT EYE 12/21/2007  . INGUINAL HERNIA, RIGHT 04/18/2007   History   Social History  . Marital Status: Married    Spouse Name: N/A  . Number of Children: 3  . Years of Education: N/A   Occupational History  . [employer]    Social History Main Topics  . Smoking status: Never Smoker   . Smokeless tobacco: Never Used  . Alcohol Use: No  . Drug Use: No  . Sexual Activity: Not on file   Other Topics Concern  . Not on file   Social History Narrative    Mr. Mercier family history includes Cancer in his father  and mother; Dementia in his mother; Diabetes in his sister; Heart disease in his brother; Hyperlipidemia in his brother. There is no history of Colon cancer or Stomach cancer.      Objective:    Filed Vitals:   01/27/15 1617  BP: 134/76  Pulse:     Physical Exam Well-developed older white male in no acute distress, blood pressure 134/76 pulse 72 , weight 174. HEENT ;nontraumatic normocephalic EOMI PERRLA sclera anicteric, Supple; no JVD,  Cardiovascular ;regular rate and rhythm with S1-S2 no murmur or gallop, Pulmonary; clear bilaterally, Abdomen ;soft he is tender in the right lower quadrant no palpable hernia or mass bowel sounds are present, Rectal ;exam not done, Extremities ;no clubbing cyanosis or edema skin warm and dry, Psych; mood and affect appropriate       Assessment & Plan:   #1 64 yo male With several month history of right lower quadrant pain the patient is status post remote right inguinal hernia repair. 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

## 2015-03-05 NOTE — Discharge Instructions (Signed)
Colonoscopy °A colonoscopy is an exam to look at your colon. This exam can help find lumps (tumors), growths (polyps), bleeding, and redness and puffiness (inflammation) in your colon.  °BEFORE THE PROCEDURE °· Ask your doctor about changing or stopping your regular medicines. °· You may need to drink a large amount of a special liquid (oral bowel prep). You start drinking this the day before your procedure. It will cause you to have watery poop (stool). This cleans out your colon. °· Do not eat or drink anything else once you have started the bowel prep, unless your doctor tells you it is safe to do so. °· Make plans for someone to drive you home after the procedure. °PROCEDURE °· You will be given medicine to help you relax (sedative). °· You will lie on your side with your knees bent. °· A tube with a camera on the end is put in the opening of your butt (anus) and into your colon. Pictures are sent to a computer screen. Your doctor will look for anything that is not normal. °· Your doctor may take a tissue sample (biopsy) from your colon to be looked at more closely. °· The exam is finished when your doctor has viewed all of the colon. °AFTER THE PROCEDURE °· Do not drive for 24 hours after the exam. °· You may have a small amount of blood in your poop. This is normal. °· You may pass gas and have belly (abdominal) cramps. This is normal. °· Ask when your test results will be ready. Make sure you get your test results. °Document Released: 09/25/2010 Document Revised: 08/28/2013 Document Reviewed: 04/30/2013 °ExitCare® Patient Information ©2015 [employer]. This information is not intended to replace advice given to you by your health care provider. Make sure you discuss any questions you have with your health care provider. ° °

## 2015-03-06 ENCOUNTER — Encounter: Payer: Self-pay | Admitting: Gastroenterology

## 2015-03-06 ENCOUNTER — Encounter (HOSPITAL_COMMUNITY): Payer: Self-pay | Admitting: Gastroenterology

## 2015-03-12 ENCOUNTER — Other Ambulatory Visit: Payer: Self-pay | Admitting: Family Medicine

## 2015-03-12 DIAGNOSIS — Z0000 Encounter for general adult medical examination without abnormal findings: Secondary | ICD-10-CM

## 2015-03-12 DIAGNOSIS — E785 Hyperlipidemia, unspecified: Secondary | ICD-10-CM

## 2015-03-18 ENCOUNTER — Other Ambulatory Visit (INDEPENDENT_AMBULATORY_CARE_PROVIDER_SITE_OTHER): Payer: Managed Care, Other (non HMO)

## 2015-03-18 DIAGNOSIS — Z0000 Encounter for general adult medical examination without abnormal findings: Secondary | ICD-10-CM

## 2015-03-18 LAB — CBC WITH DIFFERENTIAL/PLATELET
BASOS ABS: 0 10*3/uL (ref 0.0–0.1)
Basophils Relative: 0.5 % (ref 0.0–3.0)
EOS ABS: 0.1 10*3/uL (ref 0.0–0.7)
Eosinophils Relative: 2.3 % (ref 0.0–5.0)
HCT: 48.1 % (ref 39.0–52.0)
HEMOGLOBIN: 16.4 g/dL (ref 13.0–17.0)
LYMPHS ABS: 1.6 10*3/uL (ref 0.7–4.0)
Lymphocytes Relative: 30.6 % (ref 12.0–46.0)
MCHC: 34 g/dL (ref 30.0–36.0)
MCV: 96.7 fl (ref 78.0–100.0)
MONO ABS: 0.5 10*3/uL (ref 0.1–1.0)
MONOS PCT: 9.6 % (ref 3.0–12.0)
Neutro Abs: 3 10*3/uL (ref 1.4–7.7)
Neutrophils Relative %: 57 % (ref 43.0–77.0)
PLATELETS: 267 10*3/uL (ref 150.0–400.0)
RBC: 4.98 Mil/uL (ref 4.22–5.81)
RDW: 13.4 % (ref 11.5–15.5)
WBC: 5.3 10*3/uL (ref 4.0–10.5)

## 2015-03-18 LAB — LIPID PANEL
Cholesterol: 177 mg/dL (ref 0–200)
HDL: 54 mg/dL (ref >39.00)
LDL CALC: 108 mg/dL — AB (ref 0–99)
NonHDL: 123
TRIGLYCERIDES: 73 mg/dL (ref 0.0–149.0)
Total CHOL/HDL Ratio: 3
VLDL: 14.6 mg/dL (ref 0.0–40.0)

## 2015-03-18 LAB — COMPREHENSIVE METABOLIC PANEL
ALT: 23 U/L (ref 0–53)
AST: 20 U/L (ref 0–37)
Albumin: 4.1 g/dL (ref 3.5–5.2)
Alkaline Phosphatase: 71 U/L (ref 39–117)
BUN: 16 mg/dL (ref 6–23)
CO2: 33 mEq/L — ABNORMAL HIGH (ref 19–32)
Calcium: 9.5 mg/dL (ref 8.4–10.5)
Chloride: 104 mEq/L (ref 96–112)
Creatinine, Ser: 1.02 mg/dL (ref 0.40–1.50)
GFR: 78.05 mL/min (ref >60.00)
Glucose, Bld: 90 mg/dL (ref 70–99)
Potassium: 4.7 mEq/L (ref 3.5–5.1)
Sodium: 142 mEq/L (ref 135–145)
TOTAL PROTEIN: 6.7 g/dL (ref 6.0–8.3)
Total Bilirubin: 0.8 mg/dL (ref 0.2–1.2)

## 2015-03-18 LAB — PSA: PSA: 4.84 ng/mL — AB (ref 0.10–4.00)

## 2015-03-25 ENCOUNTER — Ambulatory Visit (INDEPENDENT_AMBULATORY_CARE_PROVIDER_SITE_OTHER): Payer: Managed Care, Other (non HMO) | Admitting: Family Medicine

## 2015-03-25 ENCOUNTER — Encounter: Payer: Self-pay | Admitting: Family Medicine

## 2015-03-25 ENCOUNTER — Other Ambulatory Visit: Payer: Self-pay | Admitting: Family Medicine

## 2015-03-25 VITALS — BP 120/74 | HR 82 | Temp 97.8°F | Ht 68.5 in | Wt 170.0 lb

## 2015-03-25 DIAGNOSIS — Z0000 Encounter for general adult medical examination without abnormal findings: Secondary | ICD-10-CM

## 2015-03-25 DIAGNOSIS — F528 Other sexual dysfunction not due to a substance or known physiological condition: Secondary | ICD-10-CM

## 2015-03-25 DIAGNOSIS — R972 Elevated prostate specific antigen [PSA]: Secondary | ICD-10-CM

## 2015-03-25 DIAGNOSIS — E785 Hyperlipidemia, unspecified: Secondary | ICD-10-CM | POA: Diagnosis not present

## 2015-03-25 DIAGNOSIS — Z86010 Personal history of colonic polyps: Secondary | ICD-10-CM

## 2015-03-25 DIAGNOSIS — G250 Essential tremor: Secondary | ICD-10-CM

## 2015-03-25 DIAGNOSIS — Z1159 Encounter for screening for other viral diseases: Secondary | ICD-10-CM

## 2015-03-25 MED ORDER — CYCLOBENZAPRINE HCL 5 MG PO TABS: ORAL_TABLET | ORAL | Status: DC

## 2015-03-25 MED ORDER — PROPRANOLOL HCL 40 MG PO TABS: 40.0000 mg | ORAL_TABLET | Freq: Two times a day (BID) | ORAL | Status: DC

## 2015-03-25 MED ORDER — SILDENAFIL CITRATE 20 MG PO TABS: 20.0000 mg | ORAL_TABLET | Freq: Every day | ORAL | Status: DC

## 2015-03-25 MED ORDER — TADALAFIL 20 MG PO TABS: 10.0000 mg | ORAL_TABLET | ORAL | Status: DC | PRN

## 2015-03-25 NOTE — Progress Notes (Signed)
64 yo pleasant male here for CPX, follow up of chronic medical conditions and to discuss tremors.     Colonoscopy 6/29/16Deatra Ina- 3 year recall. Td 01/22/09.  Still having some neck pain without radiation.  Unchanged.  Elevated PSA- denies any increased difficulty starting or stopping urinary stream.  Ongoing issue.  Referred him to urology last year.  Last note scanned in Epic from urology was from Dr. Jasmine December from 04/13/13.  Remote h/o neg prostate biopsy.  He is asking for cialis for ED.  Insurance will not pay for viagra per pt.   Tremor- does not typically occur at rest.  Mainly happens when he reaches out to grab something or hold something.  It is getting a little worse.  Starting to bother him at work.  He is asking to discuss tx options. Lab Results  Component Value Date   PSA 4.84* 03/18/2015   PSA 5.13* 03/15/2014   PSA 4.07* 09/14/2013   HLD- improved from last year. Lab Results  Component Value Date   CHOL 177 03/18/2015   HDL 54.00 03/18/2015   LDLCALC 108* 03/18/2015   LDLDIRECT 155.5 09/14/2013   TRIG 73.0 03/18/2015   CHOLHDL 3 03/18/2015   Lab Results  Component Value Date   CREATININE 1.02 03/18/2015   Lab Results  Component Value Date   WBC 5.3 03/18/2015   HGB 16.4 03/18/2015   HCT 48.1 03/18/2015   MCV 96.7 03/18/2015   PLT 267.0 03/18/2015   Lab Results  Component Value Date   NA 142 03/18/2015   K 4.7 03/18/2015   CL 104 03/18/2015   CO2 33* 03/18/2015   Lab Results  Component Value Date   ALT 23 03/18/2015   AST 20 03/18/2015   ALKPHOS 71 03/18/2015   BILITOT 0.8 03/18/2015    Patient Active Problem List   Diagnosis Date Noted  . History of colonic polyps 03/05/2015  . Benign neoplasm of sigmoid colon 03/05/2015  . Routine general medical examination at a health care facility 03/21/2014  . Elevated PSA, less than 10 ng/ml 03/19/2013  . Essential tremor 03/19/2013  . HLD (hyperlipidemia) 12/21/2007  . ERECTILE DYSFUNCTION  12/21/2007  . CATARACT, LEFT EYE 12/21/2007   Past Medical History  Diagnosis Date  . Hyperlipidemia   . Shortness of breath   . Right knee meniscal tear   . Arthritis     KNEES  . Colon polyps 02/24/2012    Tubular adenomas   Past Surgical History  Procedure Laterality Date  . Cardiac catheterization  06-17-2006  DR COOPER    NORMAL CORONARY ARTERIES/ NORMAL LVF  . Cardiovascular stress test  02-16-2012    NORMAL LEXISCAN NUCLEAR STUDY/ EF 61%  . Inguinal hernia repair Right 05-24-2007  . Lumbar fusion  1980'S    L4 -- S1  . Cataract extraction w/ intraocular lens  implant, bilateral    . Knee arthroscopy with medial menisectomy Right 02/16/2013    Procedure: RIGHT KNEE ARTHROSCOPY WITH PARTIAL MEDIAL AND LATERAL  MENISECTOMY;  Surgeon: Johnn Hai, MD;  Location: [facility];  Service: Orthopedics;  Laterality: Right;  . Colonoscopy N/A 03/05/2015    Procedure: COLONOSCOPY;  Surgeon: Inda Castle, MD;  Location: [facility];  Service: Endoscopy;  Laterality: N/A;   History  Substance Use Topics  . Smoking status: Never Smoker   . Smokeless tobacco: Never Used  . Alcohol Use: No   Family History  Problem Relation Age of Onset  . Cancer Mother  breast ca with mets  . Dementia Mother     Alzheimers  . Cancer Father   . Diabetes Sister   . Hyperlipidemia Brother   . Heart disease Brother     quad bypass  . Colon cancer Neg Hx   . Stomach cancer Neg Hx    No Known Allergies Current Outpatient Prescriptions on File Prior to Visit  Medication Sig Dispense Refill  . aspirin (ADULT ASPIRIN EC LOW STRENGTH) 81 MG EC tablet Take 81 mg by mouth daily.     . cyclobenzaprine (FLEXERIL) 5 MG tablet 1-2 tab po at night as needed for muscle spasms 30 tablet 1  . sildenafil (REVATIO) 20 MG tablet Take 1 tablet (20 mg total) by mouth 3 (three) times daily. (Patient taking differently: Take 20 mg by mouth daily. Take 1 tab daily.) 50 tablet 0   No current  facility-administered medications on file prior to visit.     The PMH, PSH, Social History, Family History, Medications, and allergies have been reviewed in [facility], and have been updated if relevant.   Review of Systems       See HPI +ED No penile pain No penile discharge No vomiting No changes in bowel habits- no blood in stool No difficulty with urination or increase in urinary frequency Denies any anxiety or depression No SOB or CP- works 10 hours days Denies any blurred vision +tremor- unchanged Denies LE edema No HA or blurred vision  Physical Exam BP 120/74 mmHg  Pulse 82  Temp(Src) 97.8 F (36.6 C) (Oral)  Ht 5' 8.5" (1.74 m)  Wt 170 lb (77.111 kg)  BMI 25.47 kg/m2  SpO2 97% General:  pleasant male in NAD Eyes:  PERRL Ears:  External ear exam shows no significant lesions or deformities.  Otoscopic examination reveals clear canals, tympanic membranes are intact bilaterally without bulging, retraction, inflammation or discharge. Hearing is grossly normal bilaterally. Nose:  External nasal examination shows no deformity or inflammation. Nasal mucosa are pink and moist without lesions or exudates. Mouth:  Oral mucosa and oropharynx without lesions or exudates.  Teeth in good repair. Neck:  no carotid bruit or thyromegaly no cervical or supraclavicular lymphadenopathy  Left cervical and trapezius tightness, TTP Lungs:  Normal respiratory effort, chest expands symmetrically. Lungs are clear to auscultation, no crackles or wheezes. Heart:  Normal rate and regular rhythm. S1 and S2 normal without gallop, murmur, click, rub or other extra sounds. Abdomen:  Bowel sounds positive,without masses, organomegaly or hernias noted. Pulses:  R and L posterior tibial pulses are full and equal bilaterally  Extremities:  no edema  Prostate/genital exam- deferred, has urologist Neuro:  Intention tremor of hands, unchanged

## 2015-03-25 NOTE — Assessment & Plan Note (Signed)
Persistent.  Rx given to pt for cialis.  Call or return to clinic prn if these symptoms worsen or fail to improve as anticipated. The patient indicates understanding of these issues and agrees with the plan.

## 2015-03-25 NOTE — Addendum Note (Signed)
Addended by: Ellamae Sia on: 03/25/2015 09:05 AM   Modules accepted: Orders

## 2015-03-25 NOTE — Assessment & Plan Note (Signed)
PSA stable- trending down a little from last year. Followed by urology.

## 2015-03-25 NOTE — Patient Instructions (Addendum)
Good to see you.  Check with your insurance to see if they will cover the shingles shot.

## 2015-03-25 NOTE — Assessment & Plan Note (Signed)
Deteriorated. Dicussed tx options. He would like to try propranolol. eRx sent for propranolol 40 mg twice daily. Call or return to clinic prn if these symptoms worsen or fail to improve as anticipated. The patient indicates understanding of these issues and agrees with the plan.

## 2015-03-25 NOTE — Progress Notes (Signed)
Pre visit review using our clinic review tool, if applicable. No additional management support is needed unless otherwise documented below in the visit note. 

## 2015-03-25 NOTE — Assessment & Plan Note (Signed)
Colonoscopy UTD- done last month.

## 2015-03-25 NOTE — Assessment & Plan Note (Signed)
Reviewed preventive care protocols, scheduled due services, and updated immunizations Discussed nutrition, exercise, diet, and healthy lifestyle.  Hep C and HIV labs today.  He will call insurance company regarding zostavax coverage.

## 2015-03-26 ENCOUNTER — Telehealth: Payer: Self-pay

## 2015-03-26 ENCOUNTER — Encounter: Payer: Self-pay | Admitting: *Deleted

## 2015-03-26 LAB — HIV ANTIBODY (ROUTINE TESTING W REFLEX): HIV: NONREACTIVE

## 2015-03-26 LAB — HEPATITIS C ANTIBODY: HCV Ab: NEGATIVE

## 2015-03-26 NOTE — Telephone Encounter (Signed)
Please disregard rx.  Pt decided he wanted to try cialis.  Thank you.

## 2015-03-26 NOTE — Telephone Encounter (Signed)
Bea with [facility] left v/m requesting clarification of instructions for sildenafil 20 mg # 50 instructions sent were take one tab by mouth daily or are the instructions supposed to read sildenafil 20 mg taking 2-5 tabs as needed for sexual activity. Bea request cb.

## 2015-03-26 NOTE — Telephone Encounter (Signed)
Lm on pharmacy vm and advised per Dr Deborra Medina

## 2015-04-01 ENCOUNTER — Encounter: Payer: Managed Care, Other (non HMO) | Admitting: Gastroenterology

## 2015-07-11 ENCOUNTER — Telehealth: Payer: Self-pay

## 2015-07-11 NOTE — Telephone Encounter (Signed)
Pt left v/m; pts ins will no longer pay for levitra; viagra is preferred med and pt request viagra rx to [facility]. Last annual exam on 03/25/15.

## 2015-07-14 MED ORDER — SILDENAFIL CITRATE 50 MG PO TABS: 50.0000 mg | ORAL_TABLET | Freq: Every day | ORAL | Status: DC | PRN

## 2015-07-14 NOTE — Telephone Encounter (Signed)
eRx sent

## 2015-07-14 NOTE — Telephone Encounter (Signed)
#  50 for $80 but it cant say viagra on the Rx anywhere has to be ordered as sildinafil citrate

## 2015-07-14 NOTE — Telephone Encounter (Signed)
Earl Lagos, remind me the dose and quantity that [facility] gives their deal for?

## 2015-07-16 ENCOUNTER — Telehealth: Payer: Self-pay | Admitting: *Deleted

## 2015-07-16 MED ORDER — SILDENAFIL CITRATE 50 MG PO TABS: 50.0000 mg | ORAL_TABLET | Freq: Every day | ORAL | Status: DC | PRN

## 2015-07-16 NOTE — Telephone Encounter (Signed)
The incorrect medication was sent in for pt. Please see previous phone note regarding correct Rx name. They will not cover Rx, with 50 for $80 deal, if it says viagra anywhere on the Rx

## 2015-07-16 NOTE — Telephone Encounter (Signed)
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  He states Levitra is no longer covered, but Viagra is.  Patient would like to pick up a hard copy for 3 month supply.  Please call patient when ready to pick up.

## 2015-07-17 MED ORDER — SILDENAFIL CITRATE 20 MG PO TABS: ORAL_TABLET | ORAL | Status: DC

## 2015-07-17 NOTE — Telephone Encounter (Signed)
Both [name] [name] and I entered it and viagra still came up.  Please send in appropriate rx.

## 2015-07-17 NOTE — Telephone Encounter (Signed)
This is the one that he is requesting. He is wanted it printed to pickup at office

## 2015-07-17 NOTE — Addendum Note (Signed)
Addended by: Modena Nunnery on: 07/17/2015 08:16 AM   Modules accepted: Orders

## 2015-07-17 NOTE — Telephone Encounter (Signed)
Spoke to pt and informed him Rx is available for pickup from the front desk 

## 2015-07-24 NOTE — Telephone Encounter (Signed)
Pt called back and wants different viagra rx; pt request viagra 100 mg # 10 printed with 6 months refill. Pt does not want to wait until Dr Hulen Shouts return on 07/28/15. Pt did pick up sildenafil 20 mg; does pt need to return that rx when picks up new rx for viagra 100 mg. Pt request cb when rx ready for pick up.

## 2015-07-24 NOTE — Telephone Encounter (Signed)
Spoke to pt and advised that in order for him to get the deal at [facility], he has to receive 20mg  tabs #50 for $80. Pt states he prefers to await the RTO of Dr Deborra Medina

## 2015-07-24 NOTE — Telephone Encounter (Signed)
Earl Lagos, please call pt

## 2015-07-28 NOTE — Telephone Encounter (Signed)
Ok to send in rx as he requests.

## 2015-07-28 NOTE — Telephone Encounter (Signed)
Lm on pts vm requesting a call back 

## 2015-07-30 NOTE — Telephone Encounter (Signed)
Lm on pts vm requesting a call back 

## 2015-08-13 ENCOUNTER — Other Ambulatory Visit: Payer: Self-pay | Admitting: Family Medicine

## 2015-08-13 NOTE — Telephone Encounter (Signed)
Last f/u 07/2015-CPE

## 2015-08-14 NOTE — Telephone Encounter (Signed)
Pilar Plate pharmacist at [facility] left v/m requesting clarification of instructions for sildenafil. The instructions are indicative of pulmonary hypertension; is pt supposed to take one tab three times a day or the standard sig for ED. Pilar Plate request cb.

## 2015-08-14 NOTE — Telephone Encounter (Signed)
Standard cig for ED.

## 2015-10-01 ENCOUNTER — Ambulatory Visit (INDEPENDENT_AMBULATORY_CARE_PROVIDER_SITE_OTHER): Payer: Managed Care, Other (non HMO) | Admitting: Family Medicine

## 2015-10-01 ENCOUNTER — Encounter: Payer: Self-pay | Admitting: Family Medicine

## 2015-10-01 VITALS — BP 120/80 | HR 74 | Temp 98.3°F | Wt 180.0 lb

## 2015-10-01 DIAGNOSIS — M25511 Pain in right shoulder: Secondary | ICD-10-CM

## 2015-10-01 MED ORDER — SILDENAFIL CITRATE 20 MG PO TABS: 20.0000 mg | ORAL_TABLET | Freq: Three times a day (TID) | ORAL | Status: DC

## 2015-10-01 MED ORDER — SILDENAFIL CITRATE 50 MG PO TABS: 50.0000 mg | ORAL_TABLET | Freq: Every day | ORAL | Status: DC | PRN

## 2015-10-01 MED ORDER — HYDROCODONE-ACETAMINOPHEN 5-325 MG PO TABS: 1.0000 | ORAL_TABLET | Freq: Four times a day (QID) | ORAL | Status: DC | PRN

## 2015-10-01 MED ORDER — PROPRANOLOL HCL 40 MG PO TABS: 40.0000 mg | ORAL_TABLET | Freq: Two times a day (BID) | ORAL | Status: DC

## 2015-10-01 NOTE — Progress Notes (Signed)
Pre visit review using our clinic review tool, if applicable. No additional management support is needed unless otherwise documented below in the visit note. 

## 2015-10-01 NOTE — Patient Instructions (Signed)
You have rotator cuff impingement Try to avoid painful activities (overhead activities, lifting with extended arm) as much as possible. Aleve 2 tabs twice a day with food OR ibuprofen 3 tabs three times a day with food for pain and inflammation. Can take tylenol in addition to this.  Norco as needed for severe pain.

## 2015-10-01 NOTE — Progress Notes (Signed)
SUBJECTIVE: [name] [name] is a 65 y.o. male who is complaining of  right shoulder pain 2 month(s).  No known injury but does have h/o DDD and OA.     Current Outpatient Prescriptions on File Prior to Visit  Medication Sig Dispense Refill  . aspirin (ADULT ASPIRIN EC LOW STRENGTH) 81 MG EC tablet Take 81 mg by mouth daily.     . cyclobenzaprine (FLEXERIL) 5 MG tablet 1-2 tab po at night as needed for muscle spasms 30 tablet 1  . sildenafil (REVATIO) 20 MG tablet TAKE 1 TABLET BY MOUTH THREE TIMES A DAY 50 tablet 0   No current facility-administered medications on file prior to visit.    No Known Allergies  Past Medical History  Diagnosis Date  . Hyperlipidemia   . Shortness of breath   . Right knee meniscal tear   . Arthritis     KNEES  . Colon polyps 02/24/2012    Tubular adenomas    Past Surgical History  Procedure Laterality Date  . Cardiac catheterization  06-17-2006  DR COOPER    NORMAL CORONARY ARTERIES/ NORMAL LVF  . Cardiovascular stress test  02-16-2012    NORMAL LEXISCAN NUCLEAR STUDY/ EF 61%  . Inguinal hernia repair Right 05-24-2007  . Lumbar fusion  1980'S    L4 -- S1  . Cataract extraction w/ intraocular lens  implant, bilateral    . Knee arthroscopy with medial menisectomy Right 02/16/2013    Procedure: RIGHT KNEE ARTHROSCOPY WITH PARTIAL MEDIAL AND LATERAL  MENISECTOMY;  Surgeon: Johnn Hai, MD;  Location: [place];  Service: Orthopedics;  Laterality: Right;  . Colonoscopy N/A 03/05/2015    Procedure: COLONOSCOPY;  Surgeon: Inda Castle, MD;  Location: [facility];  Service: Endoscopy;  Laterality: N/A;    Family History  Problem Relation Age of Onset  . Cancer Mother     breast ca with mets  . Dementia Mother     Alzheimers  . Cancer Father   . Diabetes Sister   . Hyperlipidemia Brother   . Heart disease Brother     quad bypass  . Colon cancer Neg Hx   . Stomach cancer Neg Hx     Social History   Social History  .  Marital Status: Legally Separated    Spouse Name: N/A  . Number of Children: 3  . Years of Education: N/A   Occupational History  . [employer]    Social History Main Topics  . Smoking status: Never Smoker   . Smokeless tobacco: Never Used  . Alcohol Use: No  . Drug Use: No  . Sexual Activity: Not on file   Other Topics Concern  . Not on file   Social History Narrative   The PMH, PSH, Social History, Family History, Medications, and allergies have been reviewed in [facility], and have been updated if relevant.  OBJECTIVE: BP 120/80 mmHg  Pulse 74  Temp(Src) 98.3 F (36.8 C) (Tympanic)  Wt 180 lb (81.647 kg)  SpO2 98%  Vital signs as noted above. Appearance: alert, well appearing, and in no distress, oriented to person, place, and time, normal appearing weight and overweight. Shoulder exam: bicipital groove tenderness, positive impingement signs, remainder of shoulder exam is normal, ipsilateral elbow, wrist and hand exam is normal. X-ray: not indicated.  ASSESSMENT: Shoulder internal derangement and underlying chronic DJD likely  PLAN: rest the injured area as much as practical See orders for this visit as documented in the  electronic medical record. NSAIDs and tylenol as directed in AVS- rx given for Norco for severe pain. The patient indicates understanding of these issues and agrees with the plan.

## 2016-03-18 ENCOUNTER — Other Ambulatory Visit: Payer: Self-pay | Admitting: Family Medicine

## 2016-03-18 DIAGNOSIS — E785 Hyperlipidemia, unspecified: Secondary | ICD-10-CM

## 2016-03-18 DIAGNOSIS — Z0000 Encounter for general adult medical examination without abnormal findings: Secondary | ICD-10-CM

## 2016-03-18 DIAGNOSIS — R972 Elevated prostate specific antigen [PSA]: Secondary | ICD-10-CM

## 2016-03-19 ENCOUNTER — Other Ambulatory Visit (INDEPENDENT_AMBULATORY_CARE_PROVIDER_SITE_OTHER): Payer: PPO

## 2016-03-19 DIAGNOSIS — Z0000 Encounter for general adult medical examination without abnormal findings: Secondary | ICD-10-CM | POA: Diagnosis not present

## 2016-03-19 DIAGNOSIS — E785 Hyperlipidemia, unspecified: Secondary | ICD-10-CM | POA: Diagnosis not present

## 2016-03-19 DIAGNOSIS — R972 Elevated prostate specific antigen [PSA]: Secondary | ICD-10-CM | POA: Diagnosis not present

## 2016-03-19 LAB — COMPREHENSIVE METABOLIC PANEL
ALK PHOS: 70 U/L (ref 39–117)
ALT: 24 U/L (ref 0–53)
AST: 19 U/L (ref 0–37)
Albumin: 4.1 g/dL (ref 3.5–5.2)
BUN: 14 mg/dL (ref 6–23)
CHLORIDE: 106 meq/L (ref 96–112)
CO2: 27 mEq/L (ref 19–32)
Calcium: 9.3 mg/dL (ref 8.4–10.5)
Creatinine, Ser: 0.81 mg/dL (ref 0.40–1.50)
GFR: 101.52 mL/min (ref >60.00)
GLUCOSE: 94 mg/dL (ref 70–99)
POTASSIUM: 4.5 meq/L (ref 3.5–5.1)
SODIUM: 141 meq/L (ref 135–145)
TOTAL PROTEIN: 6.5 g/dL (ref 6.0–8.3)
Total Bilirubin: 0.8 mg/dL (ref 0.2–1.2)

## 2016-03-19 LAB — CBC WITH DIFFERENTIAL/PLATELET
BASOS PCT: 0.7 % (ref 0.0–3.0)
Basophils Absolute: 0 10*3/uL (ref 0.0–0.1)
EOS PCT: 2.7 % (ref 0.0–5.0)
Eosinophils Absolute: 0.2 10*3/uL (ref 0.0–0.7)
HCT: 48.6 % (ref 39.0–52.0)
Hemoglobin: 16.5 g/dL (ref 13.0–17.0)
LYMPHS ABS: 1.8 10*3/uL (ref 0.7–4.0)
Lymphocytes Relative: 28.3 % (ref 12.0–46.0)
MCHC: 34 g/dL (ref 30.0–36.0)
MCV: 96 fl (ref 78.0–100.0)
MONO ABS: 0.6 10*3/uL (ref 0.1–1.0)
MONOS PCT: 9.3 % (ref 3.0–12.0)
NEUTROS ABS: 3.7 10*3/uL (ref 1.4–7.7)
NEUTROS PCT: 59 % (ref 43.0–77.0)
Platelets: 219 10*3/uL (ref 150.0–400.0)
RBC: 5.06 Mil/uL (ref 4.22–5.81)
RDW: 13.6 % (ref 11.5–15.5)
WBC: 6.2 10*3/uL (ref 4.0–10.5)

## 2016-03-19 LAB — LIPID PANEL
CHOLESTEROL: 184 mg/dL (ref 0–200)
HDL: 53 mg/dL (ref >39.00)
LDL CALC: 116 mg/dL — AB (ref 0–99)
NONHDL: 131.12
Total CHOL/HDL Ratio: 3
Triglycerides: 76 mg/dL (ref 0.0–149.0)
VLDL: 15.2 mg/dL (ref 0.0–40.0)

## 2016-03-19 LAB — PSA: PSA: 5.32 ng/mL — ABNORMAL HIGH (ref 0.10–4.00)

## 2016-03-31 ENCOUNTER — Encounter: Payer: Self-pay | Admitting: Family Medicine

## 2016-03-31 ENCOUNTER — Ambulatory Visit (INDEPENDENT_AMBULATORY_CARE_PROVIDER_SITE_OTHER): Payer: PPO | Admitting: Family Medicine

## 2016-03-31 VITALS — BP 130/82 | HR 76 | Temp 97.8°F | Ht 68.5 in | Wt 183.5 lb

## 2016-03-31 DIAGNOSIS — E785 Hyperlipidemia, unspecified: Secondary | ICD-10-CM | POA: Diagnosis not present

## 2016-03-31 DIAGNOSIS — R972 Elevated prostate specific antigen [PSA]: Secondary | ICD-10-CM

## 2016-03-31 DIAGNOSIS — G250 Essential tremor: Secondary | ICD-10-CM | POA: Diagnosis not present

## 2016-03-31 DIAGNOSIS — Z0000 Encounter for general adult medical examination without abnormal findings: Secondary | ICD-10-CM

## 2016-03-31 DIAGNOSIS — F528 Other sexual dysfunction not due to a substance or known physiological condition: Secondary | ICD-10-CM

## 2016-03-31 MED ORDER — SILDENAFIL CITRATE 100 MG PO TABS: 100.0000 mg | ORAL_TABLET | Freq: Every day | ORAL | 6 refills | Status: DC | PRN

## 2016-03-31 MED ORDER — CYCLOBENZAPRINE HCL 5 MG PO TABS: ORAL_TABLET | ORAL | 1 refills | Status: DC

## 2016-03-31 MED ORDER — SILDENAFIL CITRATE 50 MG PO TABS: 100.0000 mg | ORAL_TABLET | Freq: Every day | ORAL | 6 refills | Status: DC | PRN

## 2016-03-31 NOTE — Assessment & Plan Note (Signed)
Stable

## 2016-03-31 NOTE — Progress Notes (Signed)
65 yo pleasant male here for welcome to medicare CPX and follow up of chronic medical conditions.  Colonoscopy 6/29/16Deatra Ina- 3 year recall. Td 01/22/09.  Still having some neck pain without radiation.  Unchanged.  Elevated PSA- denies any increased difficulty starting or stopping urinary stream.  Ongoing issue.  Referred him to urology several years ago.  Last note scanned in Epic from urology was from Dr. Jasmine December from 04/13/13.  Remote h/o neg prostate biopsy.  He is asking for cialis for ED.  Insurance will not pay for viagra per pt.   Tremor-  Mainly happens when he reaches out to grab something or hold something. Propranolol has helped. Lab Results  Component Value Date   PSA 5.32 (H) 03/19/2016   PSA 4.84 (H) 03/18/2015   PSA 5.13 (H) 03/15/2014   HLD-  Lab Results  Component Value Date   CHOL 184 03/19/2016   HDL 53.00 03/19/2016   LDLCALC 116 (H) 03/19/2016   LDLDIRECT 155.5 09/14/2013   TRIG 76.0 03/19/2016   CHOLHDL 3 03/19/2016   Lab Results  Component Value Date   CREATININE 0.81 03/19/2016   Lab Results  Component Value Date   WBC 6.2 03/19/2016   HGB 16.5 03/19/2016   HCT 48.6 03/19/2016   MCV 96.0 03/19/2016   PLT 219.0 03/19/2016   Lab Results  Component Value Date   NA 141 03/19/2016   K 4.5 03/19/2016   CL 106 03/19/2016   CO2 27 03/19/2016   Lab Results  Component Value Date   ALT 24 03/19/2016   AST 19 03/19/2016   ALKPHOS 70 03/19/2016   BILITOT 0.8 03/19/2016    Patient Active Problem List  Diagnosis  . HLD (hyperlipidemia)  . ERECTILE DYSFUNCTION  . CATARACT, LEFT EYE  . Elevated PSA, less than 10 ng/ml  . Essential tremor  . History of colonic polyps  . Benign neoplasm of sigmoid colon  . Welcome to Medicare preventive visit   Past Medical History:  Diagnosis Date  . Arthritis    KNEES  . Colon polyps 02/24/2012   Tubular adenomas  . Hyperlipidemia   . Right knee meniscal tear   . Shortness of breath    Past Surgical  History:  Procedure Laterality Date  . CARDIAC CATHETERIZATION  06-17-2006  DR COOPER   NORMAL CORONARY ARTERIES/ NORMAL LVF  . CARDIOVASCULAR STRESS TEST  02-16-2012   NORMAL LEXISCAN NUCLEAR STUDY/ EF 61%  . CATARACT EXTRACTION W/ INTRAOCULAR LENS  IMPLANT, BILATERAL    . COLONOSCOPY N/A 03/05/2015   Procedure: COLONOSCOPY;  Surgeon: Inda Castle, MD;  [facility];  Service: Endoscopy;  Laterality: N/A;  . INGUINAL HERNIA REPAIR Right 05-24-2007  . KNEE ARTHROSCOPY WITH MEDIAL MENISECTOMY Right 02/16/2013   Procedure: RIGHT KNEE ARTHROSCOPY WITH PARTIAL MEDIAL AND LATERAL  MENISECTOMY;  Surgeon: Johnn Hai, MD;  Location: [place];  Service: Orthopedics;  Laterality: Right;  . LUMBAR FUSION  1980'S   L4 -- S1   Social History  Substance Use Topics  . Smoking status: Never Smoker  . Smokeless tobacco: Never Used  . Alcohol use No   Family History  Problem Relation Age of Onset  . Cancer Mother     breast ca with mets  . Dementia Mother     Alzheimers  . Cancer Father   . Diabetes Sister   . Hyperlipidemia Brother   . Heart disease Brother     quad bypass  . Colon cancer Neg  Hx   . Stomach cancer Neg Hx    No Known Allergies Current Outpatient Prescriptions on File Prior to Visit  Medication Sig Dispense Refill  . aspirin (ADULT ASPIRIN EC LOW STRENGTH) 81 MG EC tablet Take 81 mg by mouth daily.     . cyclobenzaprine (FLEXERIL) 5 MG tablet 1-2 tab po at night as needed for muscle spasms 30 tablet 1  . propranolol (INDERAL) 40 MG tablet Take 1 tablet (40 mg total) by mouth 2 (two) times daily. 60 tablet 2  . sildenafil (VIAGRA) 50 MG tablet Take 1 tablet (50 mg total) by mouth daily as needed for erectile dysfunction. 10 tablet 6  . HYDROcodone-acetaminophen (NORCO) 5-325 MG tablet Take 1-2 tablets by mouth every 6 (six) hours as needed. MAXIMUM TOTAL ACETAMINOPHEN DOSE IS 4000 MG PER DAY (Patient not taking: Reported on 03/31/2016) 30 tablet  0  . sildenafil (REVATIO) 20 MG tablet Take 1 tablet (20 mg total) by mouth 3 (three) times daily. (Patient not taking: Reported on 03/31/2016) 50 tablet 6   No current facility-administered medications on file prior to visit.      The PMH, PSH, Social History, Family History, Medications, and allergies have been reviewed in [facility], and have been updated if relevant.   Review of Systems  Constitutional: Negative.   HENT: Negative.   Eyes: Negative.   Respiratory: Negative.   Cardiovascular: Negative.   Gastrointestinal: Negative.   Endocrine: Negative.   Genitourinary: Negative.   Musculoskeletal: Negative.   Skin: Negative.   Allergic/Immunologic: Negative.   Neurological: Negative.   Hematological: Negative.   Psychiatric/Behavioral: Negative.   All other systems reviewed and are negative.   Physical Exam BP 130/82   Pulse 76   Temp 97.8 F (36.6 C) (Oral)   Ht 5' 8.5" (1.74 m)   Wt 183 lb 8 oz (83.2 kg)   BMI 27.50 kg/m  General:  pleasant male in NAD Eyes:  PERRL Ears:  External ear exam shows no significant lesions or deformities.  Otoscopic examination reveals clear canals, tympanic membranes are intact bilaterally without bulging, retraction, inflammation or discharge. Hearing is grossly normal bilaterally. Nose:  External nasal examination shows no deformity or inflammation. Nasal mucosa are pink and moist without lesions or exudates. Mouth:  Oral mucosa and oropharynx without lesions or exudates.  Teeth in good repair. Neck:  no carotid bruit or thyromegaly no cervical or supraclavicular lymphadenopathy  Lungs:  Normal respiratory effort, chest expands symmetrically. Lungs are clear to auscultation, no crackles or wheezes. Heart:  Normal rate and regular rhythm. S1 and S2 normal without gallop, murmur, click, rub or other extra sounds. Abdomen:  Bowel sounds positive,without masses, organomegaly or hernias noted. Pulses:  R and L posterior tibial pulses are full and  equal bilaterally  Extremities:  no edema  Prostate/genital exam- deferred, has urologist Neuro:  Intention tremor of hands, unchanged

## 2016-03-31 NOTE — Assessment & Plan Note (Signed)
The patients weight, height, BMI and visual acuity have been recorded in the chart.  Cognitive function assessed.   I have made referrals, counseling and provided education to the patient based review of the above and I have provided the pt with a written personalized care plan for preventive services.  

## 2016-03-31 NOTE — Assessment & Plan Note (Signed)
Viagra rx refilled and given to pt.

## 2016-03-31 NOTE — Assessment & Plan Note (Signed)
Good control. No changes made. 

## 2016-03-31 NOTE — Assessment & Plan Note (Signed)
-  Continue propranolol 

## 2016-03-31 NOTE — Patient Instructions (Signed)
Good to see you. Please make an appointment with Dr. Lorelei Pont on your way out.

## 2016-04-28 ENCOUNTER — Ambulatory Visit (HOSPITAL_COMMUNITY)
Admission: EM | Admit: 2016-04-28 | Discharge: 2016-04-28 | Disposition: A | Discharge status: Home / Self Care | Payer: Worker's Compensation | Attending: Family Medicine | Admitting: Family Medicine

## 2016-04-28 ENCOUNTER — Ambulatory Visit (INDEPENDENT_AMBULATORY_CARE_PROVIDER_SITE_OTHER): Payer: Worker's Compensation

## 2016-04-28 ENCOUNTER — Encounter (HOSPITAL_COMMUNITY): Payer: Self-pay | Admitting: Emergency Medicine

## 2016-04-28 DIAGNOSIS — S8391XA Sprain of unspecified site of right knee, initial encounter: Secondary | ICD-10-CM | POA: Diagnosis not present

## 2016-04-28 DIAGNOSIS — R52 Pain, unspecified: Secondary | ICD-10-CM

## 2016-04-28 MED ORDER — IBUPROFEN 800 MG PO TABS: 800.0000 mg | ORAL_TABLET | Freq: Three times a day (TID) | ORAL | 0 refills | Status: AC | PRN

## 2016-04-28 MED ORDER — TRAMADOL HCL 50 MG PO TABS: 50.0000 mg | ORAL_TABLET | Freq: Four times a day (QID) | ORAL | 0 refills | Status: AC | PRN

## 2016-04-28 NOTE — ED Provider Notes (Signed)
CSN: [id number]     Arrival date & time 04/28/16  1859 History   First MD Initiated Contact with Patient 04/28/16 1945     Chief Complaint  Patient presents with  . Knee Pain   (Consider location/radiation/quality/duration/timing/severity/associated sxs/prior Treatment) [name] [name] is a well-appearing 65 y.o male, presents today for right knee pain. He twisted it accidentally at work three days ago when he stepped off of a truck the wrong way, and have been experiencing the knee pain since the incident with no improvement using OTC pain medication. Pain is worst with walking. Reports pain on range of motion. He reports the it is painful to stretch his leg out. He ambulated to room.       Past Medical History:  Diagnosis Date  . Arthritis    KNEES  . Colon polyps 02/24/2012   Tubular adenomas  . Hyperlipidemia   . Right knee meniscal tear   . Shortness of breath    Past Surgical History:  Procedure Laterality Date  . CARDIAC CATHETERIZATION  06-17-2006  DR COOPER   NORMAL CORONARY ARTERIES/ NORMAL LVF  . CARDIOVASCULAR STRESS TEST  02-16-2012   NORMAL LEXISCAN NUCLEAR STUDY/ EF 61%  . CATARACT EXTRACTION W/ INTRAOCULAR LENS  IMPLANT, BILATERAL    . COLONOSCOPY N/A 03/05/2015   Procedure: COLONOSCOPY;  Surgeon: Inda Castle, MD;  [facility];  Service: Endoscopy;  Laterality: N/A;  . INGUINAL HERNIA REPAIR Right 05-24-2007  . KNEE ARTHROSCOPY WITH MEDIAL MENISECTOMY Right 02/16/2013   Procedure: RIGHT KNEE ARTHROSCOPY WITH PARTIAL MEDIAL AND LATERAL  MENISECTOMY;  Surgeon: Johnn Hai, MD;  Location: [place];  Service: Orthopedics;  Laterality: Right;  . LUMBAR FUSION  1980'S   L4 -- S1   Family History  Problem Relation Age of Onset  . Cancer Mother     breast ca with mets  . Dementia Mother     Alzheimers  . Cancer Father   . Diabetes Sister   . Hyperlipidemia Brother   . Heart disease Brother     quad bypass  . Colon cancer Neg Hx    . Stomach cancer Neg Hx    Social History  Substance Use Topics  . Smoking status: Never Smoker  . Smokeless tobacco: Never Used  . Alcohol use No    Review of Systems  Musculoskeletal:       Positive for right knee pain  All other systems reviewed and are negative.   Allergies  Review of patient's allergies indicates no known allergies.  Home Medications   Prior to Admission medications   Medication Sig Start Date End Date Taking? Authorizing Provider  propranolol (INDERAL) 40 MG tablet Take 1 tablet (40 mg total) by mouth 2 (two) times daily. 10/01/15  Yes Lucille Passy, MD  aspirin (ADULT ASPIRIN EC LOW STRENGTH) 81 MG EC tablet Take 81 mg by mouth daily.     Historical Provider, MD  cyclobenzaprine (FLEXERIL) 5 MG tablet 1-2 tab po at night as needed for muscle spasms 03/31/16   Lucille Passy, MD  HYDROcodone-acetaminophen (NORCO) 5-325 MG tablet Take 1-2 tablets by mouth every 6 (six) hours as needed. MAXIMUM TOTAL ACETAMINOPHEN DOSE IS 4000 MG PER DAY Patient not taking: Reported on 03/31/2016 10/01/15   Lucille Passy, MD  ibuprofen (ADVIL,MOTRIN) 800 MG tablet Take 1 tablet (800 mg total) by mouth every 8 (eight) hours as needed. 04/28/16 05/03/16  Barry Dienes, NP  sildenafil (REVATIO) 20 MG tablet Take  1 tablet (20 mg total) by mouth 3 (three) times daily. Patient not taking: Reported on 03/31/2016 10/01/15   Lucille Passy, MD  sildenafil (VIAGRA) 100 MG tablet Take 1 tablet (100 mg total) by mouth daily as needed for erectile dysfunction. 03/31/16   Lucille Passy, MD  traMADol (ULTRAM) 50 MG tablet Take 1 tablet (50 mg total) by mouth every 6 (six) hours as needed. 04/28/16 05/03/16  Barry Dienes, NP   Meds Ordered and Administered this Visit  Medications - No data to display  BP 138/84 (BP Location: Right Arm)   Pulse 71   Temp 98 F (36.7 C) (Oral)   Resp 18   Ht 5' 10.5" (1.791 m)   Wt 184 lb (83.5 kg)   SpO2 95%   BMI 26.03 kg/m  No data found.   Physical Exam   Constitutional: He is oriented to person, place, and time. He appears well-developed and well-nourished.  Cardiovascular: Normal rate and regular rhythm.   Pulmonary/Chest: Effort normal and breath sounds normal.  Musculoskeletal:  Knee appears symmetrical, right knee without swelling, deformity, or injury. Tender to palpate over the medial aspect of the knee.   Neurological: He is alert and oriented to person, place, and time.  Nursing note and vitals reviewed.   Urgent Care Course   Clinical Course    Procedures (including critical care time)  Labs Review Labs Reviewed - No data to display  Imaging Review Dg Knee Complete 4 Views Right  Result Date: 04/28/2016 CLINICAL DATA:  The patient suffered a right knee injury 2 days ago when trying to pull his right foot at about pallet. Initial encounter. EXAM: RIGHT KNEE - COMPLETE 4+ VIEW COMPARISON:  Plain films of the right knee 12/10/2012. FINDINGS: No evidence of fracture, dislocation, or joint effusion. No evidence of arthropathy or other focal bone abnormality. Soft tissues are unremarkable. IMPRESSION: Negative exam. Electronically Signed   By: Inge Rise M.D.   On: 04/28/2016 20:24     Visual Acuity Review  Right Eye Distance:   Left Eye Distance:   Bilateral Distance:    Right Eye Near:   Left Eye Near:    Bilateral Near:         MDM   1. Right knee sprain, initial encounter   2. Pain    Knee xray was negative for fracture, dislocation, or joint effusion. Medial collateral ligament injury cannot be ruled out. Patient instructed to follow up if orthopedic if his pain does not improve in the next 3 days. Patient has a knee splint at home; instructed to apply his knee splint when he gets home. Take Ibuprofen 800 mg TID PRN for pain. If ibuprofen does not help, take Tramadol as prescribed for pain. Rx for ibuprofen and tramadol both given. Patient verbalized understanding. Discharge instruction given. All  questions were answered.     Barry Dienes, NP 04/28/16 2050

## 2016-04-28 NOTE — ED Triage Notes (Signed)
The patient presented to the [facility] with a complaint of right knee pain secondary to twisting it at work 3 days ago when he stepped off of a truck.

## 2016-05-03 ENCOUNTER — Ambulatory Visit (INDEPENDENT_AMBULATORY_CARE_PROVIDER_SITE_OTHER): Payer: PPO | Admitting: Family Medicine

## 2016-05-03 ENCOUNTER — Encounter: Payer: Self-pay | Admitting: Family Medicine

## 2016-05-03 DIAGNOSIS — M25561 Pain in right knee: Secondary | ICD-10-CM

## 2016-05-03 NOTE — Assessment & Plan Note (Signed)
Probable meniscal and or ligamentous injury. Will need MRI and ortho. Referral placed for ortho appt. The patient indicates understanding of these issues and agrees with the plan.

## 2016-05-03 NOTE — Progress Notes (Signed)
Pre visit review using our clinic review tool, if applicable. No additional management support is needed unless otherwise documented below in the visit note. 

## 2016-05-03 NOTE — Progress Notes (Signed)
Subjective:   Patient ID: [name] [name], male    DOB: [birth year], 65 y.o.   MRN: [id number]  [name] [name] is a pleasant 65 y.o. year old male who presents to clinic today with Follow-up (R leg pain)  on 05/03/2016  HPI:  Was seen at [facility] for right knee pain on 04/28/16- note reviewed.  Slipped and fell at work the week prior.  Immediate pain and swelling right medial knee.  Hurts most with twisting and bending.  Knee xray neg.  Advised Ibuprofen 800 mg three times daily prn and as needed tramadol for severe pain.  Also given knee splint to wear.  Dg Knee Complete 4 Views Right  Result Date: 04/28/2016 CLINICAL DATA:  The patient suffered a right knee injury 2 days ago when trying to pull his right foot at about pallet. Initial encounter. EXAM: RIGHT KNEE - COMPLETE 4+ VIEW COMPARISON:  Plain films of the right knee 12/10/2012. FINDINGS: No evidence of fracture, dislocation, or joint effusion. No evidence of arthropathy or other focal bone abnormality. Soft tissues are unremarkable. IMPRESSION: Negative exam. Electronically Signed   By: Inge Rise M.D.   On: 04/28/2016 20:24    Current Outpatient Prescriptions on File Prior to Visit  Medication Sig Dispense Refill  . aspirin (ADULT ASPIRIN EC LOW STRENGTH) 81 MG EC tablet Take 81 mg by mouth daily.     . cyclobenzaprine (FLEXERIL) 5 MG tablet 1-2 tab po at night as needed for muscle spasms 30 tablet 1  . ibuprofen (ADVIL,MOTRIN) 800 MG tablet Take 1 tablet (800 mg total) by mouth every 8 (eight) hours as needed. 15 tablet 0  . propranolol (INDERAL) 40 MG tablet Take 1 tablet (40 mg total) by mouth 2 (two) times daily. 60 tablet 2  . sildenafil (VIAGRA) 100 MG tablet Take 1 tablet (100 mg total) by mouth daily as needed for erectile dysfunction. 10 tablet 6  . traMADol (ULTRAM) 50 MG tablet Take 1 tablet (50 mg total) by mouth every 6 (six) hours as needed. 10 tablet 0   No current facility-administered medications on file prior to  visit.     No Known Allergies  Past Medical History:  Diagnosis Date  . Arthritis    KNEES  . Colon polyps 02/24/2012   Tubular adenomas  . Hyperlipidemia   . Right knee meniscal tear   . Shortness of breath     Past Surgical History:  Procedure Laterality Date  . CARDIAC CATHETERIZATION  06-17-2006  DR COOPER   NORMAL CORONARY ARTERIES/ NORMAL LVF  . CARDIOVASCULAR STRESS TEST  02-16-2012   NORMAL LEXISCAN NUCLEAR STUDY/ EF 61%  . CATARACT EXTRACTION W/ INTRAOCULAR LENS  IMPLANT, BILATERAL    . COLONOSCOPY N/A 03/05/2015   Procedure: COLONOSCOPY;  Surgeon: Inda Castle, MD;  [facility]: [facility];  Service: Endoscopy;  Laterality: N/A;  . INGUINAL HERNIA REPAIR Right 05-24-2007  . KNEE ARTHROSCOPY WITH MEDIAL MENISECTOMY Right 02/16/2013   Procedure: RIGHT KNEE ARTHROSCOPY WITH PARTIAL MEDIAL AND LATERAL  MENISECTOMY;  Surgeon: Johnn Hai, MD;  Location: [place];  Service: Orthopedics;  Laterality: Right;  . LUMBAR FUSION  1980'S   L4 -- S1    Family History  Problem Relation Age of Onset  . Cancer Mother     breast ca with mets  . Dementia Mother     Alzheimers  . Cancer Father   . Diabetes Sister   . Hyperlipidemia Brother   . Heart disease  Brother     quad bypass  . Colon cancer Neg Hx   . Stomach cancer Neg Hx     Social History   Social History  . Marital status: Legally Separated    Spouse name: N/A  . Number of children: 3  . Years of education: N/A   Occupational History  . [employer] [place]    Social History Main Topics  . Smoking status: Never Smoker  . Smokeless tobacco: Never Used  . Alcohol use No  . Drug use: No  . Sexual activity: Not on file   Other Topics Concern  . Not on file   Social History Narrative   Would desire CPR   He does have a living Will      The PMH, [place], Social History, Family History, Medications, and allergies have been reviewed in [facility], and have been updated if  relevant.   Review of Systems  Musculoskeletal: Positive for arthralgias and gait problem.  Neurological: Negative for weakness.  All other systems reviewed and are negative.      Objective:    BP 132/74   Pulse 65   Temp 97.7 F (36.5 C) (Oral)   Wt 184 lb 4 oz (83.6 kg)   SpO2 96%   BMI 26.06 kg/m    Physical Exam  Constitutional: He is oriented to person, place, and time. He appears well-developed and well-nourished. No distress.  HENT:  Head: Normocephalic.  Cardiovascular: Normal rate.   Pulmonary/Chest: Effort normal.  Musculoskeletal:       Right knee: He exhibits swelling. Tenderness found. Medial joint line tenderness noted.  Neurological: He is alert and oriented to person, place, and time. No cranial nerve deficit.  Skin: Skin is warm and dry. He is not diaphoretic.  Psychiatric: He has a normal mood and affect. His behavior is normal. Judgment and thought content normal.  Nursing note and vitals reviewed.         Assessment & Plan:   Right knee pain - Plan: Ambulatory referral to Orthopedic Surgery No Follow-up on file.

## 2016-05-03 NOTE — Patient Instructions (Signed)
Great to see you. Please stop by to see Rosaria Ferries or [place] on your way out.

## 2016-11-03 IMAGING — DX DG KNEE COMPLETE 4+V*R*
4 series · 4 of 4 positions shown · non-contrast
Comparison: Plain films of the right knee 12/10/2012.

CLINICAL DATA: The patient suffered a right knee injury 2 days ago
when trying to pull his right foot at about pallet. Initial
encounter.

EXAM:
RIGHT KNEE - COMPLETE 4+ VIEW

[knee ap]
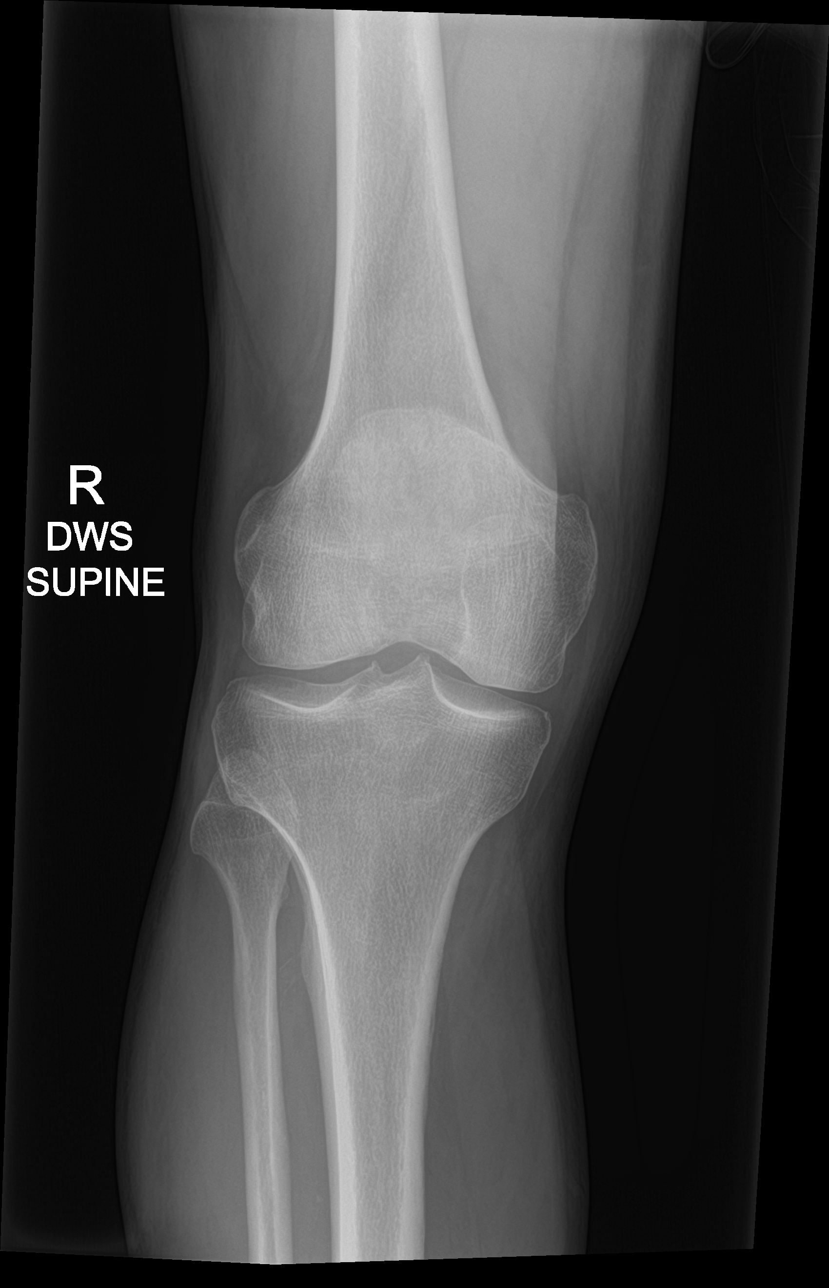

[knee obl (1 of 2)]
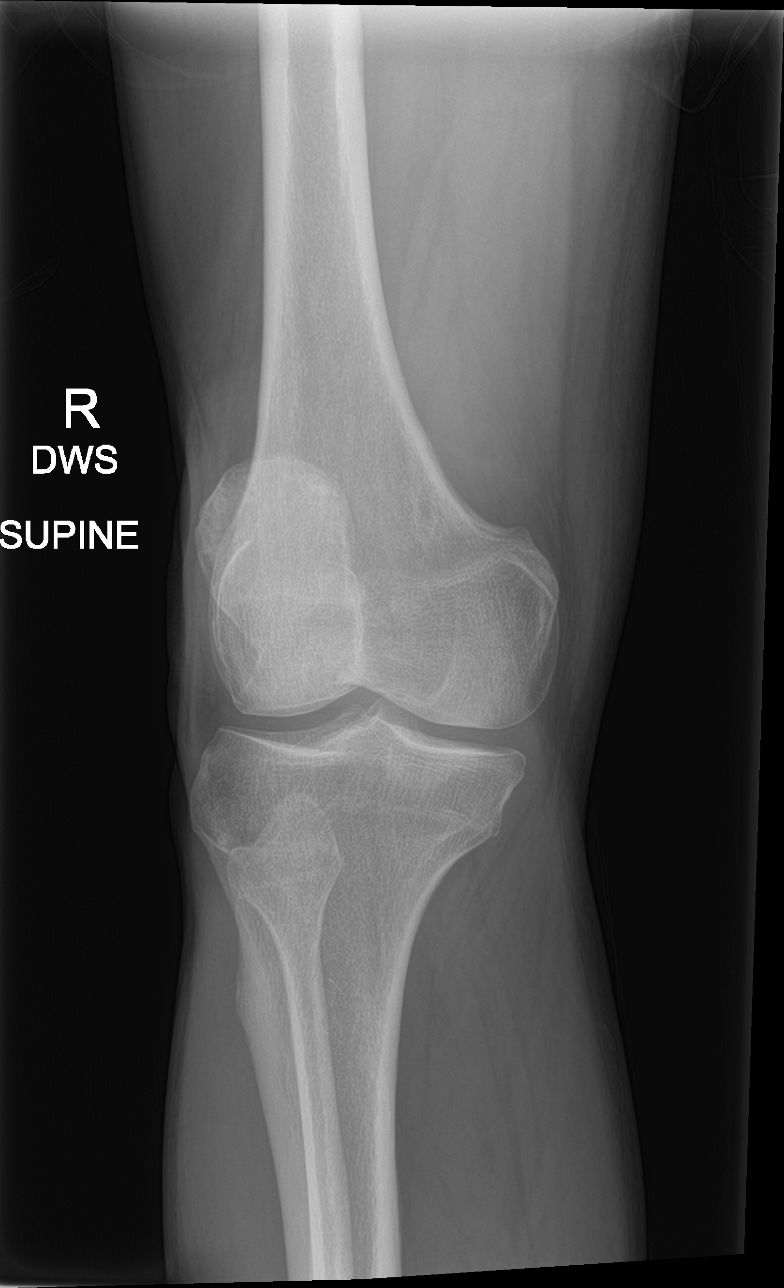

[knee obl (2 of 2)]
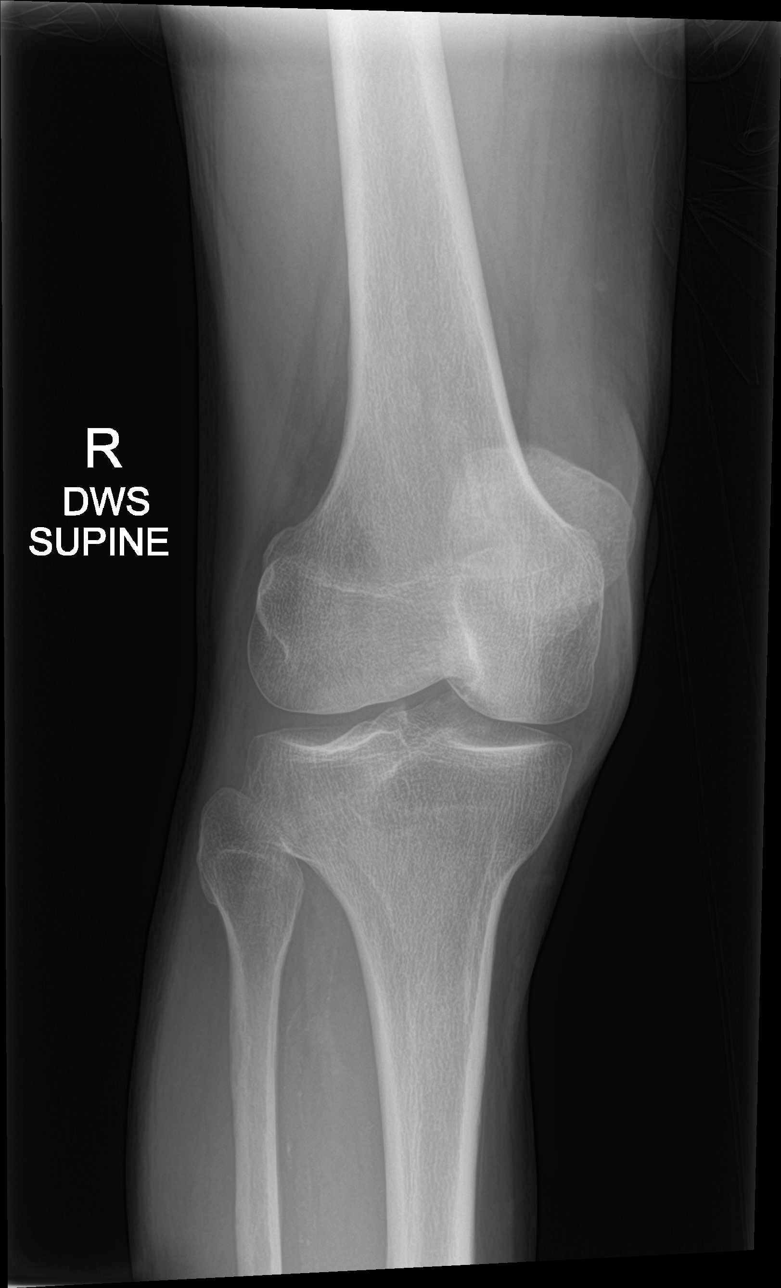

[knee lat]
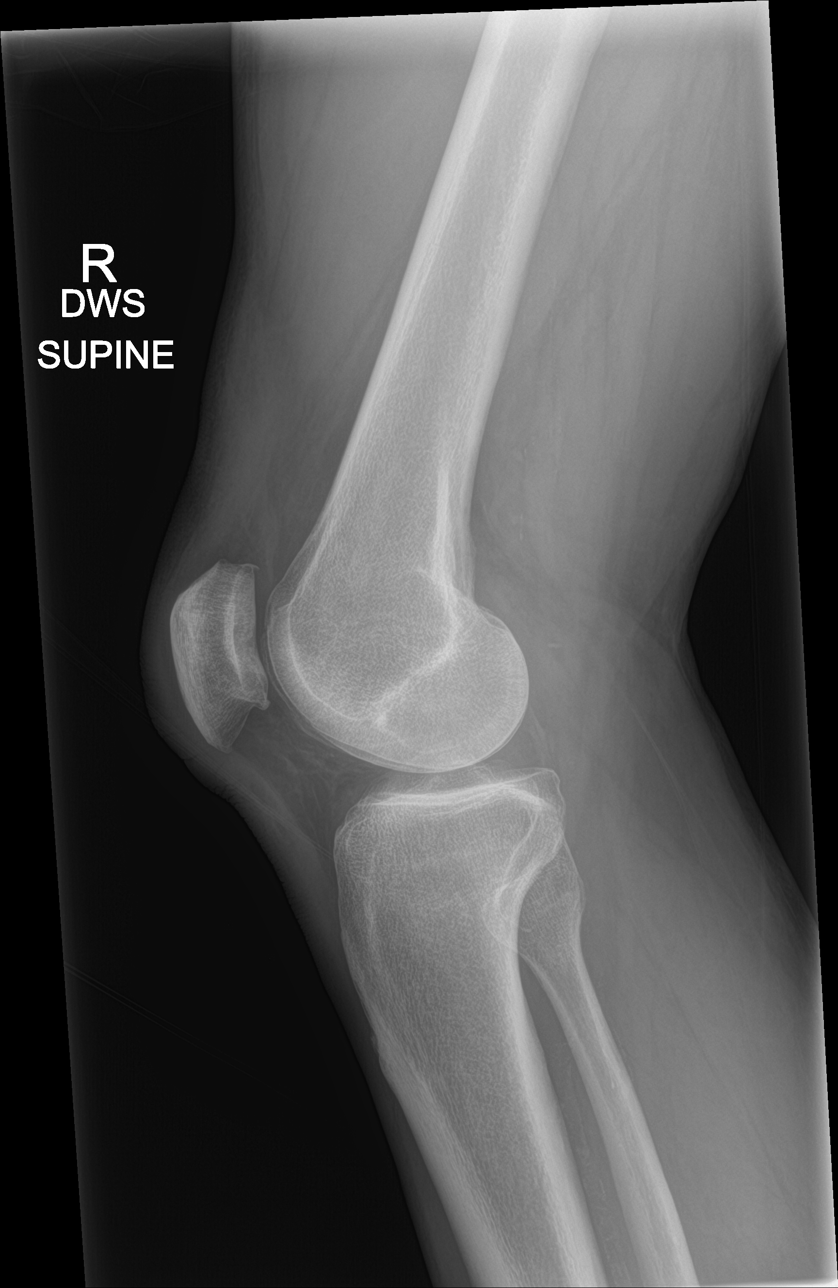

[4 of 4 positions shown; findings below may reference images not displayed]

FINDINGS: No evidence of fracture, dislocation, or joint effusion. No evidence
of arthropathy or other focal bone abnormality. Soft tissues are
unremarkable.
IMPRESSION: Negative exam.

## 2017-12-01 ENCOUNTER — Ambulatory Visit (INDEPENDENT_AMBULATORY_CARE_PROVIDER_SITE_OTHER): Payer: 59 | Admitting: Family Medicine

## 2017-12-01 ENCOUNTER — Encounter: Payer: Self-pay | Admitting: Family Medicine

## 2017-12-01 VITALS — BP 124/86 | HR 71 | Temp 98.5°F | Ht 69.0 in | Wt 181.2 lb

## 2017-12-01 DIAGNOSIS — Z23 Encounter for immunization: Secondary | ICD-10-CM | POA: Diagnosis not present

## 2017-12-01 DIAGNOSIS — E785 Hyperlipidemia, unspecified: Secondary | ICD-10-CM | POA: Diagnosis not present

## 2017-12-01 DIAGNOSIS — D125 Benign neoplasm of sigmoid colon: Secondary | ICD-10-CM | POA: Diagnosis not present

## 2017-12-01 DIAGNOSIS — Z0000 Encounter for general adult medical examination without abnormal findings: Secondary | ICD-10-CM | POA: Insufficient documentation

## 2017-12-01 DIAGNOSIS — R972 Elevated prostate specific antigen [PSA]: Secondary | ICD-10-CM | POA: Diagnosis not present

## 2017-12-01 DIAGNOSIS — G250 Essential tremor: Secondary | ICD-10-CM | POA: Diagnosis not present

## 2017-12-01 DIAGNOSIS — F528 Other sexual dysfunction not due to a substance or known physiological condition: Secondary | ICD-10-CM | POA: Diagnosis not present

## 2017-12-01 LAB — COMPREHENSIVE METABOLIC PANEL
ALBUMIN: 4 g/dL (ref 3.5–5.2)
ALT: 22 U/L (ref 0–53)
AST: 20 U/L (ref 0–37)
Alkaline Phosphatase: 74 U/L (ref 39–117)
BUN: 16 mg/dL (ref 6–23)
CALCIUM: 9.2 mg/dL (ref 8.4–10.5)
CHLORIDE: 104 meq/L (ref 96–112)
CO2: 29 mEq/L (ref 19–32)
CREATININE: 0.91 mg/dL (ref 0.40–1.50)
GFR: 88.3 mL/min (ref >60.00)
Glucose, Bld: 114 mg/dL — ABNORMAL HIGH (ref 70–99)
POTASSIUM: 4.1 meq/L (ref 3.5–5.1)
Sodium: 140 mEq/L (ref 135–145)
Total Bilirubin: 0.9 mg/dL (ref 0.2–1.2)
Total Protein: 6.7 g/dL (ref 6.0–8.3)

## 2017-12-01 LAB — LIPID PANEL
CHOLESTEROL: 180 mg/dL (ref 0–200)
HDL: 58 mg/dL (ref >39.00)
LDL Cholesterol: 109 mg/dL — ABNORMAL HIGH (ref 0–99)
NonHDL: 121.57
Total CHOL/HDL Ratio: 3
Triglycerides: 65 mg/dL (ref 0.0–149.0)
VLDL: 13 mg/dL (ref 0.0–40.0)

## 2017-12-01 LAB — PSA: PSA: 5.68 ng/mL — AB (ref 0.10–4.00)

## 2017-12-01 MED ORDER — SILDENAFIL CITRATE 100 MG PO TABS: 100.0000 mg | ORAL_TABLET | Freq: Every day | ORAL | 6 refills | Status: AC | PRN

## 2017-12-01 MED ORDER — PROPRANOLOL HCL 40 MG PO TABS: 40.0000 mg | ORAL_TABLET | Freq: Two times a day (BID) | ORAL | 2 refills | Status: AC

## 2017-12-01 MED ORDER — CYCLOBENZAPRINE HCL 5 MG PO TABS: ORAL_TABLET | ORAL | 2 refills | Status: AC

## 2017-12-01 NOTE — Patient Instructions (Addendum)
Great to see you. I will call you with your lab results from today and you can view them online.   Get the phone number for the GI doctor your boss sees and call me back.

## 2017-12-01 NOTE — Progress Notes (Signed)
67 yo pleasant male here for follow up. Colonoscopy 6/29/16Deatra Ina- 3 year recall. Td 01/22/09.  Health Maintenance  Topic Date Due  . PNA vac Low Risk Adult (1 of 2 - PCV13) 10/24/2015  . INFLUENZA VACCINE  08/02/2018 (Originally 04/06/2017)  . COLONOSCOPY  03/04/2018  . TETANUS/TDAP  01/23/2019  . Hepatitis C Screening  Completed    Still having some neck pain without radiation.  Unchanged.  Elevated PSA- denies any increased difficulty starting or stopping urinary stream.  Ongoing issue. Remote h/o neg prostate biopsy.  Asking for refill of his viagra.  Tremor-  Mainly happens when he reaches out to grab something or hold something. Propranolol has helped. Lab Results  Component Value Date   PSA 5.32 (H) 03/19/2016   PSA 4.84 (H) 03/18/2015   PSA 5.13 (H) 03/15/2014   HLD-  Lab Results  Component Value Date   CHOL 184 03/19/2016   HDL 53.00 03/19/2016   LDLCALC 116 (H) 03/19/2016   LDLDIRECT 155.5 09/14/2013   TRIG 76.0 03/19/2016   CHOLHDL 3 03/19/2016   Lab Results  Component Value Date   CREATININE 0.81 03/19/2016   Lab Results  Component Value Date   WBC 6.2 03/19/2016   HGB 16.5 03/19/2016   HCT 48.6 03/19/2016   MCV 96.0 03/19/2016   PLT 219.0 03/19/2016   Lab Results  Component Value Date   NA 141 03/19/2016   K 4.5 03/19/2016   CL 106 03/19/2016   CO2 27 03/19/2016   Lab Results  Component Value Date   ALT 24 03/19/2016   AST 19 03/19/2016   ALKPHOS 70 03/19/2016   BILITOT 0.8 03/19/2016    Patient Active Problem List   Diagnosis Date Noted  . History of colonic polyps 03/05/2015  . Benign neoplasm of sigmoid colon 03/05/2015  . Elevated PSA, less than 10 ng/ml 03/19/2013  . Essential tremor 03/19/2013  . HLD (hyperlipidemia) 12/21/2007  . ERECTILE DYSFUNCTION 12/21/2007  . CATARACT, LEFT EYE 12/21/2007   Past Medical History:  Diagnosis Date  . Arthritis    KNEES  . Colon polyps 02/24/2012   Tubular adenomas  . Hyperlipidemia    . Right knee meniscal tear   . Shortness of breath    Past Surgical History:  Procedure Laterality Date  . CARDIAC CATHETERIZATION  06-17-2006  DR COOPER   NORMAL CORONARY ARTERIES/ NORMAL LVF  . CARDIOVASCULAR STRESS TEST  02-16-2012   NORMAL LEXISCAN NUCLEAR STUDY/ EF 61%  . CATARACT EXTRACTION W/ INTRAOCULAR LENS  IMPLANT, BILATERAL    . COLONOSCOPY N/A 03/05/2015   Procedure: COLONOSCOPY;  Surgeon: Inda Castle, MD;  Location: [facility];  Service: Endoscopy;  Laterality: N/A;  . INGUINAL HERNIA REPAIR Right 05-24-2007  . KNEE ARTHROSCOPY WITH MEDIAL MENISECTOMY Right 02/16/2013   Procedure: RIGHT KNEE ARTHROSCOPY WITH PARTIAL MEDIAL AND LATERAL  MENISECTOMY;  Surgeon: Johnn Hai, MD;  Location: [place];  Service: Orthopedics;  Laterality: Right;  . LUMBAR FUSION  1980'S   L4 -- S1   Social History   Tobacco Use  . Smoking status: Never Smoker  . Smokeless tobacco: Never Used  Substance Use Topics  . Alcohol use: No  . Drug use: No   Family History  Problem Relation Age of Onset  . Cancer Mother        breast ca with mets  . Dementia Mother        Alzheimers  . Cancer Father   . Diabetes Sister   .  Hyperlipidemia Brother   . Heart disease Brother        quad bypass  . COPD Sister   . Cancer Sister        Metastatic  . Heart attack Brother        x2  . Diabetes Son   . Colon cancer Neg Hx   . Stomach cancer Neg Hx    Not on File Current Outpatient Medications on File Prior to Visit  Medication Sig Dispense Refill  . aspirin (ADULT ASPIRIN EC LOW STRENGTH) 81 MG EC tablet Take 81 mg by mouth daily.     . cyclobenzaprine (FLEXERIL) 5 MG tablet 1-2 tab po at night as needed for muscle spasms 30 tablet 1  . propranolol (INDERAL) 40 MG tablet Take 1 tablet (40 mg total) by mouth 2 (two) times daily. 60 tablet 2   No current facility-administered medications on file prior to visit.      The PMH, PSH, Social History, Family History,  Medications, and allergies have been reviewed in [facility], and have been updated if relevant.   Review of Systems  Constitutional: Negative.   HENT: Negative.   Eyes: Negative.   Respiratory: Negative.   Cardiovascular: Negative.   Gastrointestinal: Negative.   Endocrine: Negative.   Genitourinary: Negative.   Musculoskeletal: Negative.   Skin: Negative.   Allergic/Immunologic: Negative.   Neurological: Negative.   Hematological: Negative.   Psychiatric/Behavioral: Negative.   All other systems reviewed and are negative.   Physical Exam BP 124/86 (BP Location: Left Arm, Patient Position: Sitting, Cuff Size: Normal)   Pulse 71   Temp 98.5 F (36.9 C) (Oral)   Ht 5\' 9"  (1.753 m)   Wt 181 lb 3.2 oz (82.2 kg)   SpO2 97%   BMI 26.76 kg/m  General:  pleasant male in NAD Eyes:  PERRL Ears:  External ear exam shows no significant lesions or deformities.  Otoscopic examination reveals clear canals, tympanic membranes are intact bilaterally without bulging, retraction, inflammation or discharge. Hearing is grossly normal bilaterally. Nose:  External nasal examination shows no deformity or inflammation. Nasal mucosa are pink and moist without lesions or exudates. Mouth:  Oral mucosa and oropharynx without lesions or exudates.  Teeth in good repair. Neck:  no carotid bruit or thyromegaly no cervical or supraclavicular lymphadenopathy  Lungs:  Normal respiratory effort, chest expands symmetrically. Lungs are clear to auscultation, no crackles or wheezes. Heart:  Normal rate and regular rhythm. S1 and S2 normal without gallop, murmur, click, rub or other extra sounds. Abdomen:  Bowel sounds positive,without masses, organomegaly or hernias noted. Pulses:  R and L posterior tibial pulses are full and equal bilaterally  Extremities:  no edema  Neuro:  Intention tremor of hands, unchanged

## 2017-12-01 NOTE — Assessment & Plan Note (Signed)
Labs today

## 2017-12-01 NOTE — Assessment & Plan Note (Signed)
Improved on current rx.

## 2017-12-01 NOTE — Assessment & Plan Note (Signed)
PSA today

## 2019-01-26 ENCOUNTER — Telehealth: Payer: Self-pay

## 2019-01-26 NOTE — Telephone Encounter (Signed)
[facility] Night - Client Nonclinical Telephone Record [facility] Night - Client Client Site [place] Physician Arnette Norris- MD Contact Type Call Who Is Calling Physician / Provider / Hospital Call Type Provider Call Message Only Reason for Call Request to send message to Office Initial [place] Paramedic with [facility] calling in a notification of death. Additional Comment Notification of Death: Dana Dorner Sr., DOB: 04-Jul-1951, TOD: 18:22. Call Closed By: Hassel Neth Transaction Date/Time:  6:47:09 PM (ET)

## 2019-01-26 NOTE — Telephone Encounter (Signed)
Josh, Therapist, nutritional with [facility] on [place] left v/m pt died 02/15/2019 at home unexpectedly; wants to discuss signing of death certificate. I called back and Josh was not available and I gave administrative asst info that This is Dr Hulen Shouts pt at [facility] and gave [phone] for [place] to call. FYI to [employer]

## 2019-01-26 NOTE — Telephone Encounter (Signed)
[name] [name] calling back to inquire if Dr. Deborra Medina would be willing to sign death certificate.

## 2019-01-26 NOTE — Telephone Encounter (Signed)
[name] [name] stated when I spoke with him  somethings are still unclear to the funeral home as well. I told [name] [name] I was gone to have you call on Tuesday but if you want to call today the number is [phone].

## 2019-01-26 NOTE — Telephone Encounter (Signed)
Spoke with Vonna Kotyk at funeral home and made him aware that Dr. Deborra Medina not in office today or Tuesday but will send a message to her to see if she will sign when she is back in office. See Deborra Medina please see message below. Patient died at home unexpectedly.

## 2019-01-26 NOTE — Telephone Encounter (Signed)
Will route to Dr. Deborra Medina who is now at the [facility] location.

## 2019-01-26 NOTE — Telephone Encounter (Signed)
He's quite young to die unexpectedly.  Is this not an ME case?

## 2019-01-31 NOTE — Telephone Encounter (Signed)
I spoke to a man at the funeral home who said that EMS dropped the body off to them and advised them that Dr. Deborra Medina would be dealing with this/I asked him to get the EMS to call and ask for me to discuss this as this is insufficient with no known death and no examination was completed to determine the cause of death/thx dmf

## 2019-01-31 NOTE — Telephone Encounter (Signed)
[name] [name] from the funeral home RTC to advise me of the following:  EMS-[name] [name]: [phone]  Police: [phone] concerning this pt that was found dead on 12-27-2022 evening approx 7pm  I LMOVM for both to RTC as there is no medical examination to determine cause of death for a body that was found and there needs to be more in depth information in order for the provider to sign off on this individual in which she has not seen in well over a year/thx dmf

## 2019-01-31 NOTE — Telephone Encounter (Signed)
I have his death certificate but I have no way of knowing what did from.  Was this not a medical examiner's case?

## 2019-02-05 DEATH — deceased

## 2019-02-06 NOTE — Telephone Encounter (Signed)
This is complete/funeral home has picked up form/thx dmf
